# Patient Record
Sex: Female | Born: 1980 | Race: White | Hispanic: No | Marital: Married | Smoking: Former smoker
Health system: Southern US, Academic
[De-identification: ages and names within clinical notes are randomized; demographics above are authoritative.]

## PROBLEM LIST (undated history)

## (undated) DIAGNOSIS — R202 Paresthesia of skin: Secondary | ICD-10-CM

## (undated) DIAGNOSIS — M6289 Other specified disorders of muscle: Secondary | ICD-10-CM

## (undated) DIAGNOSIS — Z87891 Personal history of nicotine dependence: Secondary | ICD-10-CM

## (undated) DIAGNOSIS — E161 Other hypoglycemia: Secondary | ICD-10-CM

## (undated) DIAGNOSIS — I10 Essential (primary) hypertension: Secondary | ICD-10-CM

## (undated) DIAGNOSIS — R5383 Other fatigue: Secondary | ICD-10-CM

## (undated) DIAGNOSIS — E871 Hypo-osmolality and hyponatremia: Secondary | ICD-10-CM

## (undated) DIAGNOSIS — R0600 Dyspnea, unspecified: Secondary | ICD-10-CM

## (undated) DIAGNOSIS — M545 Low back pain, unspecified: Secondary | ICD-10-CM

## (undated) DIAGNOSIS — Z1239 Encounter for other screening for malignant neoplasm of breast: Secondary | ICD-10-CM

## (undated) DIAGNOSIS — G4700 Insomnia, unspecified: Secondary | ICD-10-CM

## (undated) DIAGNOSIS — K529 Noninfective gastroenteritis and colitis, unspecified: Secondary | ICD-10-CM

## (undated) DIAGNOSIS — E538 Deficiency of other specified B group vitamins: Secondary | ICD-10-CM

## (undated) DIAGNOSIS — N390 Urinary tract infection, site not specified: Secondary | ICD-10-CM

## (undated) DIAGNOSIS — G4733 Obstructive sleep apnea (adult) (pediatric): Secondary | ICD-10-CM

## (undated) DIAGNOSIS — M79601 Pain in right arm: Secondary | ICD-10-CM

## (undated) DIAGNOSIS — M79602 Pain in left arm: Secondary | ICD-10-CM

## (undated) DIAGNOSIS — N946 Dysmenorrhea, unspecified: Secondary | ICD-10-CM

## (undated) HISTORY — DX: Encounter for other screening for malignant neoplasm of breast: Z12.39

## (undated) HISTORY — DX: Other specified disorders of muscle: M62.89

## (undated) HISTORY — DX: Paresthesia of skin: R20.2

## (undated) HISTORY — DX: Pain in right arm: M79.601

## (undated) HISTORY — DX: Deficiency of other specified B group vitamins: E53.8

## (undated) HISTORY — DX: Insomnia, unspecified: G47.00

## (undated) HISTORY — DX: Obstructive sleep apnea (adult) (pediatric): G47.33

## (undated) HISTORY — DX: Urinary tract infection, site not specified: N39.0

## (undated) HISTORY — DX: Dyspnea, unspecified: R06.00

## (undated) HISTORY — DX: Other fatigue: R53.83

## (undated) HISTORY — DX: Hypo-osmolality and hyponatremia: E87.1

## (undated) HISTORY — DX: Pain in left arm: M79.602

## (undated) HISTORY — DX: Personal history of nicotine dependence: Z87.891

## (undated) HISTORY — DX: Other hypoglycemia: E16.1

## (undated) HISTORY — DX: Dysmenorrhea, unspecified: N94.6

## (undated) HISTORY — DX: Low back pain, unspecified: M54.50

## (undated) HISTORY — DX: Essential (primary) hypertension: I10

## (undated) HISTORY — DX: Noninfective gastroenteritis and colitis, unspecified: K52.9

## (undated) HISTORY — DX: Morbid (severe) obesity due to excess calories (CMS HCC): E66.01

---
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
## 2006-02-05 HISTORY — PX: HX LASER EYE SURGERY: 2100001140

## 2019-04-23 HISTORY — PX: TONSILLECTOMY: SUR1361

## 2021-06-14 ENCOUNTER — Other Ambulatory Visit: Payer: Self-pay

## 2021-06-14 ENCOUNTER — Ambulatory Visit (RURAL_HEALTH_CENTER): Payer: Commercial Managed Care - PPO | Attending: Family | Admitting: Family

## 2021-06-14 ENCOUNTER — Encounter (RURAL_HEALTH_CENTER): Payer: Self-pay | Admitting: Family

## 2021-06-14 VITALS — Temp 97.8°F | Resp 18 | Ht 63.0 in | Wt 178.0 lb

## 2021-06-14 DIAGNOSIS — F32A Depression, unspecified: Secondary | ICD-10-CM | POA: Insufficient documentation

## 2021-06-14 DIAGNOSIS — E669 Obesity, unspecified: Secondary | ICD-10-CM

## 2021-06-14 DIAGNOSIS — E039 Hypothyroidism, unspecified: Secondary | ICD-10-CM | POA: Insufficient documentation

## 2021-06-14 DIAGNOSIS — R7301 Impaired fasting glucose: Secondary | ICD-10-CM | POA: Insufficient documentation

## 2021-06-14 DIAGNOSIS — E282 Polycystic ovarian syndrome: Secondary | ICD-10-CM | POA: Insufficient documentation

## 2021-06-14 DIAGNOSIS — Z7984 Long term (current) use of oral hypoglycemic drugs: Secondary | ICD-10-CM | POA: Insufficient documentation

## 2021-06-14 DIAGNOSIS — E559 Vitamin D deficiency, unspecified: Secondary | ICD-10-CM | POA: Insufficient documentation

## 2021-06-14 DIAGNOSIS — E538 Deficiency of other specified B group vitamins: Secondary | ICD-10-CM | POA: Insufficient documentation

## 2021-06-14 DIAGNOSIS — Z6831 Body mass index (BMI) 31.0-31.9, adult: Secondary | ICD-10-CM | POA: Insufficient documentation

## 2021-06-14 DIAGNOSIS — K219 Gastro-esophageal reflux disease without esophagitis: Secondary | ICD-10-CM | POA: Insufficient documentation

## 2021-06-14 MED ORDER — FLUOXETINE 40 MG CAPSULE
40.0000 mg | ORAL_CAPSULE | Freq: Every day | ORAL | 1 refills | Status: DC
Start: 2021-06-14 — End: 2021-09-15

## 2021-06-14 MED ORDER — CYANOCOBALAMIN (VIT B-12) 1,000 MCG/ML INJECTION SOLUTION
1000.0000 ug | INTRAMUSCULAR | 0 refills | Status: AC
Start: 2021-06-14 — End: 2021-09-15

## 2021-06-14 MED ORDER — FAMOTIDINE 40 MG TABLET
40.0000 mg | ORAL_TABLET | Freq: Every day | ORAL | 1 refills | Status: DC
Start: 2021-06-14 — End: 2021-09-15

## 2021-06-14 MED ORDER — METFORMIN ER 500 MG TABLET,EXTENDED RELEASE 24 HR
500.0000 mg | ORAL_TABLET | Freq: Two times a day (BID) | ORAL | 1 refills | Status: DC
Start: 2021-06-14 — End: 2021-09-15

## 2021-06-14 MED ORDER — LEVOTHYROXINE 100 MCG TABLET
100.0000 ug | ORAL_TABLET | Freq: Every day | ORAL | 1 refills | Status: DC
Start: 2021-06-14 — End: 2021-09-15

## 2021-06-14 MED ORDER — SPIRONOLACTONE 100 MG TABLET
100.0000 mg | ORAL_TABLET | Freq: Every day | ORAL | 1 refills | Status: DC
Start: 2021-06-14 — End: 2021-12-22

## 2021-06-14 MED ORDER — SIMVASTATIN 40 MG TABLET
40.0000 mg | ORAL_TABLET | Freq: Every day | ORAL | 1 refills | Status: DC
Start: 2021-06-14 — End: 2021-09-15

## 2021-06-14 NOTE — Nursing Note (Signed)
Patient here today for 3 month follow-up and medication refills. Patient state  no new concerns at this time.

## 2021-06-14 NOTE — Progress Notes (Signed)
FAMILY MEDICINE, [place]  [place] [id number]  Operated by [facility]     Name: Nichole Suarez MRN:  [id number]   Date of Birth: 02-14-1980 Age: 41 y.o.   Date: 06/14/2021  Time: 08:42     Provider: Jodi Mourning, NP-C    Reason for visit: Follow Up (3 month and medication refills.)      History of Present Illness:  Nichole Suarez is a 41 y.o. female presenting with chronic disease management.    Patient is attending [facility] diabetes[facility] for management of weight and prescription of Mounjaro.  Patient has lost 60lbs in the past year.  Patient reports she is eating less and eating healthier foods.          Patient Active Problem List    Diagnosis Date Noted   . Acquired hypothyroidism 06/14/2021   . Gastroesophageal reflux disease 06/14/2021   . Depression 06/14/2021   . PCOS (polycystic ovarian syndrome) 06/14/2021   . IFG (impaired fasting glucose) 06/14/2021   . Vitamin D deficiency 06/14/2021   . B12 deficiency 06/14/2021   . Obesity (BMI 30.0-34.9) 06/14/2021       Historical Data    Past Medical History:  Past Medical History:   Diagnosis Date   . Benign hypertension    . Chronic diarrhea of unknown origin    . Dysmenorrhea    . Dyspnea    . Ex-smoker    . Fatigue    . Hyperinsulinemia    . Hyponatremia    . Insomnia    . Low back pain    . Lower urinary tract infectious disease    . Morbid obesity (CMS [place])    . Muscle fatigue    . Obstructive sleep apnea syndrome    . Paresthesia and pain of both upper extremities    . Vitamin B 12 deficiency      Past Surgical History:  Past Surgical History:   Procedure Laterality Date   . HX LASER EYE SURGERY  2008   . TONSILLECTOMY  04/23/2019     Allergies:  Allergies   Allergen Reactions   . Amoxicillin  Other Adverse Reaction (Add comment)     Abdominal pain   . Clavulanic Acid  Other Adverse Reaction (Add comment)     Abdominal pain     . Lisinopril  Other Adverse  Reaction (Add comment)     Abdominal Pain     Medications:  Current Outpatient Medications   Medication Sig   . cetirizine (ZYRTEC) 10 mg Oral Tablet Take 1 Tablet (10 mg total) by mouth Once a day   . cyanocobalamin (VITAMIN B12) 1,000 mcg/mL Injection Solution Inject 1 mL (1,000 mcg total) under the skin Every 30 days for 90 days   . famotidine (PEPCID) 40 mg Oral Tablet Take 1 Tablet (40 mg total) by mouth Once a day for 90 days   . FLUoxetine (PROZAC) 40 mg Oral Capsule Take 1 Capsule (40 mg total) by mouth Once a day for 90 days   . levothyroxine (SYNTHROID) 100 mcg Oral Tablet Take 1 Tablet (100 mcg total) by mouth Once a day for 90 days   . metFORMIN (GLUCOPHAGE XR) 500 mg Oral Tablet Sustained Release 24 hr Take 1 Tablet (500 mg total) by mouth Twice daily for 90 days   . MOUNJARO 5 mg/0.5 mL Subcutaneous Pen Injector Inject 0.5 mL (5 mg total) under the skin  Every 7 days   . norethindrone-e.estradioL-iron (equiv to: LoLOESTRIN Fe) 1 mg-10 mcg (24)/10 mcg (2) Oral Tablet Take 1 Tablet by mouth Once a day   . simvastatin (ZOCOR) 40 mg Oral Tablet Take 1 Tablet (40 mg total) by mouth Once a day for 90 days   . spironolactone (ALDACTONE) 100 mg Oral Tablet Take 1 Tablet (100 mg total) by mouth Once a day for 90 days     Family History:  Family Medical History:     Problem Relation (Age of Onset)    Diabetes type II Father    Heart Attack Father    Hypothyroidism Father    Iron deficiency Mother    Stroke Father          Social History:  Social History     Socioeconomic History   . Marital status: Married     Spouse name: Vonna Kotyk   . Number of children: 0   . Years of education: 12+   Occupational History     Comment: [employer].     Comment: [employer]; [employer]   Tobacco Use   . Smoking status: Former     Packs/day: 0.50     Years: 18.00     Pack years: 9.00     Types: Cigarettes     Start date: 1999     Quit date: 2017     Years since quitting: 6.3   . Smokeless tobacco: Current   Vaping Use    . Vaping Use: Every day   . Substances: Nicotine (3mg /1104ml lasts 3 weeks), Flavoring   . Devices: Refillable tank   Substance and Sexual Activity   . Alcohol use: Not Currently   . Drug use: Never   . Sexual activity: Yes     Partners: Male     Social Determinants of Health     Financial Resource Strain: Low Risk    . SDOH Financial: No   Transportation Needs: Low Risk    . SDOH Transportation: No   Social Connections: Low Risk    . SDOH Social Isolation: 5 or more times a week   Intimate Partner Violence: Low Risk    . SDOH Domestic Violence: No   Housing Stability: Low Risk    . SDOH Housing Situation: I have housing.   Marland Kitchen SDOH Housing Worry: No           Review of Systems:  Any pertinent Review of Systems as addressed in the HPI above.    Physical Exam:  Vital Signs:  Vitals:    06/14/21 0816   Resp: 18   Temp: 36.6 C (97.8 F)   TempSrc: Temporal   Weight: 80.7 kg (178 lb)   Height: 1.6 m (5\' 3" )   BMI: 31.6     Physical Exam  Constitutional:       Appearance: She is obese.   HENT:      Head: Normocephalic.      Right Ear: Tympanic membrane normal.      Left Ear: Tympanic membrane normal.      Nose: Nose normal.      Mouth/Throat:      Mouth: Mucous membranes are dry.      Pharynx: Oropharynx is clear.   Eyes:      Extraocular Movements: Extraocular movements intact.      Conjunctiva/sclera: Conjunctivae normal.      Pupils: Pupils are equal, round, and reactive to light.   Cardiovascular:  Rate and Rhythm: Normal rate and regular rhythm.      Pulses: Normal pulses.      Heart sounds: Normal heart sounds.   Pulmonary:      Effort: Pulmonary effort is normal.      Breath sounds: Normal breath sounds.   Abdominal:      General: Bowel sounds are normal.      Palpations: Abdomen is soft.   Musculoskeletal:         General: Normal range of motion.      Cervical back: Normal range of motion and neck supple.   Skin:     General: Skin is warm and dry.   Neurological:      General: No focal deficit present.       Mental Status: She is alert and oriented to person, place, and time.   Psychiatric:         Mood and Affect: Mood normal.         Behavior: Behavior normal.         Thought Content: Thought content normal.         Judgment: Judgment normal.          Assessment/Plan:  (E03.9) Acquired hypothyroidism  (primary encounter diagnosis)  Plan: CBC, COMPREHENSIVE METABOLIC PNL, FASTING,         HGA1C (HEMOGLOBIN A1C WITH EST AVG GLUCOSE),         INSULIN, SERUM, LIPID PANEL, MAGNESIUM, VITAMIN        B12, VITAMIN D 25 TOTAL, THYROID STIMULATING         HORMONE (SENSITIVE TSH), CANCELED: HGA1C         (HEMOGLOBIN A1C WITH EST AVG GLUCOSE),         CANCELED: CBC, CANCELED: COMPREHENSIVE         METABOLIC PNL, FASTING, CANCELED: LIPID PANEL,         CANCELED: MAGNESIUM, CANCELED: THYROID         STIMULATING HORMONE (SENSITIVE TSH), CANCELED:         VITAMIN D 25 TOTAL, CANCELED: VITAMIN B12,         CANCELED: INSULIN, SERUM    (K21.9) Gastroesophageal reflux disease, unspecified whether esophagitis present  Plan: CBC, COMPREHENSIVE METABOLIC PNL, FASTING,         HGA1C (HEMOGLOBIN A1C WITH EST AVG GLUCOSE),         INSULIN, SERUM, LIPID PANEL, MAGNESIUM, VITAMIN        B12, VITAMIN D 25 TOTAL, THYROID STIMULATING         HORMONE (SENSITIVE TSH), CANCELED: HGA1C         (HEMOGLOBIN A1C WITH EST AVG GLUCOSE),         CANCELED: CBC, CANCELED: COMPREHENSIVE         METABOLIC PNL, FASTING, CANCELED: LIPID PANEL,         CANCELED: MAGNESIUM, CANCELED: THYROID         STIMULATING HORMONE (SENSITIVE TSH), CANCELED:         VITAMIN D 25 TOTAL, CANCELED: VITAMIN B12,         CANCELED: INSULIN, SERUM    (F32.A) Depression  Plan: CBC, COMPREHENSIVE METABOLIC PNL, FASTING,         HGA1C (HEMOGLOBIN A1C WITH EST AVG GLUCOSE),         INSULIN, SERUM, LIPID PANEL, MAGNESIUM, VITAMIN        B12, VITAMIN D 25 TOTAL, THYROID STIMULATING         HORMONE (SENSITIVE TSH), CANCELED:  HGA1C         (HEMOGLOBIN A1C WITH EST AVG GLUCOSE),          CANCELED: CBC, CANCELED: COMPREHENSIVE         METABOLIC PNL, FASTING, CANCELED: LIPID PANEL,         CANCELED: MAGNESIUM, CANCELED: THYROID         STIMULATING HORMONE (SENSITIVE TSH), CANCELED:         VITAMIN D 25 TOTAL, CANCELED: VITAMIN B12,         CANCELED: INSULIN, SERUM    (E28.2) PCOS (polycystic ovarian syndrome)  Plan: CBC, COMPREHENSIVE METABOLIC PNL, FASTING,         HGA1C (HEMOGLOBIN A1C WITH EST AVG GLUCOSE),         INSULIN, SERUM, LIPID PANEL, MAGNESIUM, VITAMIN        B12, VITAMIN D 25 TOTAL, THYROID STIMULATING         HORMONE (SENSITIVE TSH), CANCELED: HGA1C         (HEMOGLOBIN A1C WITH EST AVG GLUCOSE),         CANCELED: CBC, CANCELED: COMPREHENSIVE         METABOLIC PNL, FASTING, CANCELED: LIPID PANEL,         CANCELED: MAGNESIUM, CANCELED: THYROID         STIMULATING HORMONE (SENSITIVE TSH), CANCELED:         VITAMIN D 25 TOTAL, CANCELED: VITAMIN B12,         CANCELED: INSULIN, SERUM    (R73.01) IFG (impaired fasting glucose)  Plan: CBC, COMPREHENSIVE METABOLIC PNL, FASTING,         HGA1C (HEMOGLOBIN A1C WITH EST AVG GLUCOSE),         INSULIN, SERUM, LIPID PANEL, MAGNESIUM, VITAMIN        B12, VITAMIN D 25 TOTAL, THYROID STIMULATING         HORMONE (SENSITIVE TSH), CANCELED: HGA1C         (HEMOGLOBIN A1C WITH EST AVG GLUCOSE),         CANCELED: CBC, CANCELED: COMPREHENSIVE         METABOLIC PNL, FASTING, CANCELED: LIPID PANEL,         CANCELED: MAGNESIUM, CANCELED: THYROID         STIMULATING HORMONE (SENSITIVE TSH), CANCELED:         VITAMIN D 25 TOTAL, CANCELED: VITAMIN B12,         CANCELED: INSULIN, SERUM    (E55.9) Vitamin D deficiency  Plan: CBC, COMPREHENSIVE METABOLIC PNL, FASTING,         HGA1C (HEMOGLOBIN A1C WITH EST AVG GLUCOSE),         INSULIN, SERUM, LIPID PANEL, MAGNESIUM, VITAMIN        B12, VITAMIN D 25 TOTAL, THYROID STIMULATING         HORMONE (SENSITIVE TSH), CANCELED: HGA1C         (HEMOGLOBIN A1C WITH EST AVG GLUCOSE),         CANCELED: CBC, CANCELED: COMPREHENSIVE          METABOLIC PNL, FASTING, CANCELED: LIPID PANEL,         CANCELED: MAGNESIUM, CANCELED: THYROID         STIMULATING HORMONE (SENSITIVE TSH), CANCELED:         VITAMIN D 25 TOTAL, CANCELED: VITAMIN B12,         CANCELED: INSULIN, SERUM    (E53.8) B12 deficiency  Plan: CBC, COMPREHENSIVE METABOLIC PNL, FASTING,  HGA1C (HEMOGLOBIN A1C WITH EST AVG GLUCOSE),         INSULIN, SERUM, LIPID PANEL, MAGNESIUM, VITAMIN        B12, VITAMIN D 25 TOTAL, THYROID STIMULATING         HORMONE (SENSITIVE TSH), CANCELED: HGA1C         (HEMOGLOBIN A1C WITH EST AVG GLUCOSE),         CANCELED: CBC, CANCELED: COMPREHENSIVE         METABOLIC PNL, FASTING, CANCELED: LIPID PANEL,         CANCELED: MAGNESIUM, CANCELED: THYROID         STIMULATING HORMONE (SENSITIVE TSH), CANCELED:         VITAMIN D 25 TOTAL, CANCELED: VITAMIN B12,         CANCELED: INSULIN, SERUM    (E66.9) Obesity (BMI 30.0-34.9)  Plan: CBC, COMPREHENSIVE METABOLIC PNL, FASTING,         HGA1C (HEMOGLOBIN A1C WITH EST AVG GLUCOSE),         INSULIN, SERUM, LIPID PANEL, MAGNESIUM, VITAMIN        B12, VITAMIN D 25 TOTAL, THYROID STIMULATING         HORMONE (SENSITIVE TSH), CANCELED: HGA1C         (HEMOGLOBIN A1C WITH EST AVG GLUCOSE),         CANCELED: CBC, CANCELED: COMPREHENSIVE         METABOLIC PNL, FASTING, CANCELED: LIPID PANEL,         CANCELED: MAGNESIUM, CANCELED: THYROID         STIMULATING HORMONE (SENSITIVE TSH), CANCELED:         VITAMIN D 25 TOTAL, CANCELED: VITAMIN B12,         CANCELED: INSULIN, SERUM       Labs drawn today.  Continue current medications.  Advised a low-fat/low sodium diet, advised 150 minutes of scheduled weekly physical activity as tolerated.  Advised to maintain a healthy weight.      Return in about 3 months (around 09/14/2021) for routine visit; scheduled adult wellness visit.    Jodi Mourning, NP-C     Portions of this note may be dictated using voice recognition software or a dictation service. Variances in spelling and vocabulary  are possible and unintentional. Not all errors are caught/corrected. Please notify the [facility] if any discrepancies are noted or if the meaning of any statement is not clear.

## 2021-07-29 DIAGNOSIS — E782 Mixed hyperlipidemia: Secondary | ICD-10-CM | POA: Insufficient documentation

## 2021-07-29 DIAGNOSIS — I10 Essential (primary) hypertension: Secondary | ICD-10-CM | POA: Insufficient documentation

## 2021-07-29 DIAGNOSIS — R7303 Prediabetes: Secondary | ICD-10-CM | POA: Insufficient documentation

## 2021-09-15 ENCOUNTER — Ambulatory Visit (RURAL_HEALTH_CENTER): Payer: Commercial Managed Care - PPO | Attending: Family | Admitting: Family

## 2021-09-15 ENCOUNTER — Other Ambulatory Visit: Payer: Self-pay

## 2021-09-15 ENCOUNTER — Encounter (RURAL_HEALTH_CENTER): Payer: Self-pay | Admitting: Family

## 2021-09-15 VITALS — BP 120/84 | HR 78 | Temp 98.7°F | Ht 63.0 in | Wt 168.0 lb

## 2021-09-15 DIAGNOSIS — E282 Polycystic ovarian syndrome: Secondary | ICD-10-CM | POA: Insufficient documentation

## 2021-09-15 DIAGNOSIS — F32A Depression, unspecified: Secondary | ICD-10-CM | POA: Insufficient documentation

## 2021-09-15 DIAGNOSIS — Z6830 Body mass index (BMI) 30.0-30.9, adult: Secondary | ICD-10-CM | POA: Insufficient documentation

## 2021-09-15 DIAGNOSIS — R7301 Impaired fasting glucose: Secondary | ICD-10-CM | POA: Insufficient documentation

## 2021-09-15 DIAGNOSIS — G43909 Migraine, unspecified, not intractable, without status migrainosus: Secondary | ICD-10-CM | POA: Insufficient documentation

## 2021-09-15 DIAGNOSIS — F17290 Nicotine dependence, other tobacco product, uncomplicated: Secondary | ICD-10-CM | POA: Insufficient documentation

## 2021-09-15 DIAGNOSIS — E669 Obesity, unspecified: Secondary | ICD-10-CM | POA: Insufficient documentation

## 2021-09-15 DIAGNOSIS — K219 Gastro-esophageal reflux disease without esophagitis: Secondary | ICD-10-CM | POA: Insufficient documentation

## 2021-09-15 DIAGNOSIS — E039 Hypothyroidism, unspecified: Secondary | ICD-10-CM | POA: Insufficient documentation

## 2021-09-15 DIAGNOSIS — E538 Deficiency of other specified B group vitamins: Secondary | ICD-10-CM | POA: Insufficient documentation

## 2021-09-15 DIAGNOSIS — E559 Vitamin D deficiency, unspecified: Secondary | ICD-10-CM | POA: Insufficient documentation

## 2021-09-15 MED ORDER — FLUOXETINE 40 MG CAPSULE
40.0000 mg | ORAL_CAPSULE | Freq: Every day | ORAL | 1 refills | Status: DC
Start: 2021-09-15 — End: 2021-12-22

## 2021-09-15 MED ORDER — SIMVASTATIN 40 MG TABLET
40.0000 mg | ORAL_TABLET | Freq: Every day | ORAL | 1 refills | Status: DC
Start: 2021-09-15 — End: 2021-12-22

## 2021-09-15 MED ORDER — METFORMIN ER 500 MG TABLET,EXTENDED RELEASE 24 HR
500.0000 mg | ORAL_TABLET | Freq: Two times a day (BID) | ORAL | 1 refills | Status: DC
Start: 2021-09-15 — End: 2021-12-22

## 2021-09-15 MED ORDER — LEVOTHYROXINE 100 MCG TABLET
100.0000 ug | ORAL_TABLET | Freq: Every day | ORAL | 1 refills | Status: DC
Start: 2021-09-15 — End: 2021-09-22

## 2021-09-15 MED ORDER — FAMOTIDINE 40 MG TABLET
40.0000 mg | ORAL_TABLET | Freq: Every day | ORAL | 1 refills | Status: DC
Start: 2021-09-15 — End: 2021-12-22

## 2021-09-15 MED ORDER — FLUOXETINE 40 MG CAPSULE
40.0000 mg | ORAL_CAPSULE | Freq: Every day | ORAL | 1 refills | Status: DC
Start: 2021-09-15 — End: 2021-09-15

## 2021-09-15 MED ORDER — LEVOTHYROXINE 100 MCG TABLET
100.0000 ug | ORAL_TABLET | Freq: Every day | ORAL | 1 refills | Status: DC
Start: 2021-09-15 — End: 2021-09-15

## 2021-09-15 MED ORDER — SUMATRIPTAN 25 MG TABLET
25.0000 mg | ORAL_TABLET | Freq: Once | ORAL | 0 refills | Status: DC | PRN
Start: 2021-09-15 — End: 2021-12-22

## 2021-09-15 NOTE — Progress Notes (Signed)
FAMILY MEDICINE, [facility] FAMILY MEDICINE [facility]  [address]  [place] [address]  Operated by [facility]     Name: Nichole Suarez MRN:  [id number]   Date of Birth: 04-27-1980 Age: 41 y.o.   Date: 09/15/2021  Time: 09:22     Provider: Stormy Fabian, NP-C    Reason for visit: No chief complaint on file.      History of Present Illness:  Nichole Suarez is a 41 y.o. female presenting with chronic disease management.    Patient has stopped [facility] 08/07/21.  Patient continues to lose weight, lost 10lbs since last visit.  Continue to limit portions, increase water intake and limit snacking.  Regular diet.  Exercise 1 mil 3-4 x per week on treadmill.     Patient reports a hx of menstrual migraines since the age of 14.  Reports Ibuprofen 800mg  no longer works.  Reports last migraine last week and lasted for 3 days.  Reports sensitivity to light.  Denies phonophobia.  Reports nausea with migraines, but no vomiting.  Denies numbness or tingling.      Patient Active Problem List    Diagnosis Date Noted    Migraine 09/15/2021    Acquired hypothyroidism 06/14/2021    Gastroesophageal reflux disease 06/14/2021    Depression 06/14/2021    PCOS (polycystic ovarian syndrome) 06/14/2021    IFG (impaired fasting glucose) 06/14/2021    Vitamin D deficiency 06/14/2021    B12 deficiency 06/14/2021    Obesity (BMI 30.0-34.9) 06/14/2021       Historical Data    Past Medical History:  Past Medical History:   Diagnosis Date    Benign hypertension     Chronic diarrhea of unknown origin     Dysmenorrhea     Dyspnea     Ex-smoker     Fatigue     Hyperinsulinemia     Hyponatremia     Insomnia     Low back pain     Lower urinary tract infectious disease     Morbid obesity (CMS [facility])     Muscle fatigue     Obstructive sleep apnea syndrome     Paresthesia and pain of both upper extremities     Vitamin B 12 deficiency      Past Surgical History:  Past Surgical History:   Procedure Laterality Date    HX  LASER EYE SURGERY  2008    TONSILLECTOMY  04/23/2019     Allergies:  Allergies   Allergen Reactions    Amoxicillin  Other Adverse Reaction (Add comment)     Abdominal pain    Clavulanic Acid  Other Adverse Reaction (Add comment)     Abdominal pain      Lisinopril  Other Adverse Reaction (Add comment)     Abdominal Pain     Medications:  Current Outpatient Medications   Medication Sig    cetirizine (ZYRTEC) 10 mg Oral Tablet Take 1 Tablet (10 mg total) by mouth Once a day    cyanocobalamin (VITAMIN B12) 1,000 mcg/mL Injection Solution Inject 1 mL (1,000 mcg total) under the skin Every 30 days for 90 days    famotidine (PEPCID) 40 mg Oral Tablet Take 1 Tablet (40 mg total) by mouth Once a day for 90 days    FLUoxetine (PROZAC) 40 mg Oral Capsule Take 1 Capsule (40 mg total) by mouth Once a day for 90 days    levothyroxine (SYNTHROID) 100 mcg Oral Tablet Take 1  Tablet (100 mcg total) by mouth Once a day for 90 days    metFORMIN (GLUCOPHAGE XR) 500 mg Oral Tablet Sustained Release 24 hr Take 1 Tablet (500 mg total) by mouth Twice daily for 90 days    norethindrone-e.estradioL-iron (equiv to: LoLOESTRIN Fe) 1 mg-10 mcg (24)/10 mcg (2) Oral Tablet Take 1 Tablet by mouth Once a day    simvastatin (ZOCOR) 40 mg Oral Tablet Take 1 Tablet (40 mg total) by mouth Once a day for 90 days    spironolactone (ALDACTONE) 100 mg Oral Tablet Take 1 Tablet (100 mg total) by mouth Once a day for 90 days    SUMAtriptan (IMITREX) 25 mg Oral Tablet Take 1 Tablet (25 mg total) by mouth Once, as needed for Migraine for up to 1 dose May repeat in 2 hours in needed     Family History:  Family Medical History:       Problem Relation (Age of Onset)    Diabetes type II Father    Heart Attack Father    Hypothyroidism Father    Iron deficiency Mother    No Known Problems Sister, Brother, Half-Sister, Half-Brother, Maternal Aunt, Maternal Uncle, Paternal Aunt, Paternal Uncle, Maternal Grandmother, Maternal Grandfather, Paternal Grandmother, Paternal  26, Daughter, Son    Stroke Father            Social History:  Social History     Socioeconomic History    Marital status: Married     Spouse name: Vonna Kotyk    Number of children: 0    Years of education: 12+   Occupational History     Comment: [employer].     Comment: [employer]; [employer]   Tobacco Use    Smoking status: Former     Packs/day: 0.50     Years: 18.00     Pack years: 9.00     Types: Cigarettes     Start date: 1999     Quit date: 2017     Years since quitting: 6.6    Smokeless tobacco: Never   Vaping Use    Vaping Use: Every day    Substances: Nicotine (3mg /158ml lasts 3 weeks), Flavoring    Devices: Refillable tank   Substance and Sexual Activity    Alcohol use: Not Currently    Drug use: Never    Sexual activity: Yes     Partners: Male     Social Determinants of Health     Financial Resource Strain: Low Risk     SDOH Financial: No   Transportation Needs: Low Risk     SDOH Transportation: No   Social Connections: Low Risk     SDOH Social Isolation: 5 or more times a week   Intimate Partner Violence: Low Risk     SDOH Domestic Violence: No   Housing Stability: [place] Situation: I have housing.    SDOH Housing Worry: No           Review of Systems:  Any pertinent Review of Systems as addressed in the HPI above.    Physical Exam:  Vital Signs:  Vitals:    09/15/21 0832   BP: 120/84   Pulse: 78   Temp: 37.1 C (98.7 F)   TempSrc: Tympanic   SpO2: 97%   Weight: 76.2 kg (168 lb)   Height: 1.6 m (5\' 3" )   BMI: 29.82     Physical Exam  Constitutional:  Appearance: She is obese.   HENT:      Head: Normocephalic.   Cardiovascular:      Rate and Rhythm: Normal rate and regular rhythm.      Pulses: Normal pulses.           Radial pulses are 2+ on the right side and 2+ on the left side.      Heart sounds: Normal heart sounds, S1 normal and S2 normal.   Pulmonary:      Effort: Pulmonary effort is normal.      Breath sounds: Normal breath sounds.   Abdominal:       General: Bowel sounds are normal.      Palpations: Abdomen is soft.   Musculoskeletal:         General: Normal range of motion.      Cervical back: Neck supple.      Right lower leg: No edema.      Left lower leg: No edema.   Skin:     General: Skin is warm.   Neurological:      General: No focal deficit present.      Mental Status: She is alert and oriented to person, place, and time.      Cranial Nerves: Cranial nerves 2-12 are intact.      Coordination: Coordination is intact.      Gait: Gait is intact.   Psychiatric:         Mood and Affect: Mood normal.         Speech: Speech normal.         Behavior: Behavior normal. Behavior is cooperative.         Thought Content: Thought content normal.         Cognition and Memory: Cognition normal.         Judgment: Judgment normal.        Assessment/Plan:  (E03.9) Acquired hypothyroidism  (primary encounter diagnosis)  Plan: HGA1C (HEMOGLOBIN A1C WITH EST AVG GLUCOSE),         MICROALBUMIN/CREATININE RATIO, URINE, RANDOM,         CBC, COMPREHENSIVE METABOLIC PNL, FASTING,         LIPID PANEL, MAGNESIUM, THYROID STIMULATING         HORMONE (SENSITIVE TSH), VITAMIN D 25 TOTAL,         VITAMIN B12, INSULIN, SERUM    (K21.9) Gastroesophageal reflux disease, unspecified whether esophagitis present  Plan: HGA1C (HEMOGLOBIN A1C WITH EST AVG GLUCOSE),         MICROALBUMIN/CREATININE RATIO, URINE, RANDOM,         CBC, COMPREHENSIVE METABOLIC PNL, FASTING,         LIPID PANEL, MAGNESIUM, THYROID STIMULATING         HORMONE (SENSITIVE TSH), VITAMIN D 25 TOTAL,         VITAMIN B12, INSULIN, SERUM    (F32.A) Depression, unspecified depression type  Plan: HGA1C (HEMOGLOBIN A1C WITH EST AVG GLUCOSE),         MICROALBUMIN/CREATININE RATIO, URINE, RANDOM,         CBC, COMPREHENSIVE METABOLIC PNL, FASTING,         LIPID PANEL, MAGNESIUM, THYROID STIMULATING         HORMONE (SENSITIVE TSH), VITAMIN D 25 TOTAL,         VITAMIN B12, INSULIN, SERUM    (E28.2) PCOS (polycystic ovarian  syndrome)  Plan: HGA1C (HEMOGLOBIN A1C WITH EST AVG GLUCOSE),  MICROALBUMIN/CREATININE RATIO, URINE, RANDOM,         CBC, COMPREHENSIVE METABOLIC PNL, FASTING,         LIPID PANEL, MAGNESIUM, THYROID STIMULATING         HORMONE (SENSITIVE TSH), VITAMIN D 25 TOTAL,         VITAMIN B12, INSULIN, SERUM    (E66.9) Obesity (BMI 30.0-34.9)  Plan: HGA1C (HEMOGLOBIN A1C WITH EST AVG GLUCOSE),         MICROALBUMIN/CREATININE RATIO, URINE, RANDOM,         CBC, COMPREHENSIVE METABOLIC PNL, FASTING,         LIPID PANEL, MAGNESIUM, THYROID STIMULATING         HORMONE (SENSITIVE TSH), VITAMIN D 25 TOTAL,         VITAMIN B12, INSULIN, SERUM    (R73.01) IFG (impaired fasting glucose)  Plan: HGA1C (HEMOGLOBIN A1C WITH EST AVG GLUCOSE),         MICROALBUMIN/CREATININE RATIO, URINE, RANDOM,         CBC, COMPREHENSIVE METABOLIC PNL, FASTING,         LIPID PANEL, MAGNESIUM, THYROID STIMULATING         HORMONE (SENSITIVE TSH), VITAMIN D 25 TOTAL,         VITAMIN B12, INSULIN, SERUM    (E55.9) Vitamin D deficiency  Plan: HGA1C (HEMOGLOBIN A1C WITH EST AVG GLUCOSE),         MICROALBUMIN/CREATININE RATIO, URINE, RANDOM,         CBC, COMPREHENSIVE METABOLIC PNL, FASTING,         LIPID PANEL, MAGNESIUM, THYROID STIMULATING         HORMONE (SENSITIVE TSH), VITAMIN D 25 TOTAL,         VITAMIN B12, INSULIN, SERUM    (E53.8) B12 deficiency  Plan: HGA1C (HEMOGLOBIN A1C WITH EST AVG GLUCOSE),         MICROALBUMIN/CREATININE RATIO, URINE, RANDOM,         CBC, COMPREHENSIVE METABOLIC PNL, FASTING,         LIPID PANEL, MAGNESIUM, THYROID STIMULATING         HORMONE (SENSITIVE TSH), VITAMIN D 25 TOTAL,         VITAMIN B12, INSULIN, SERUM    (G43.909) Migraine   Start Imitrex for migraine relief.  Discussed s/e and risks.     Labs drawn today.  Continue current medications.  Advised a low-fat/low sodium diet, advised 150 minutes of scheduled weekly physical activity as tolerated.  Advised to maintain a healthy weight.     Return in about 3 months  (around 12/16/2021) for routine visit; schedule adult wellness visit.    Jodi Mourning, NP-C     Portions of this note may be dictated using voice recognition software or a dictation service. Variances in spelling and vocabulary are possible and unintentional. Not all errors are caught/corrected. Please notify the [facility] if any discrepancies are noted or if the meaning of any statement is not clear.

## 2021-09-22 ENCOUNTER — Other Ambulatory Visit (RURAL_HEALTH_CENTER): Payer: Self-pay | Admitting: Family

## 2021-09-22 MED ORDER — LEVOTHYROXINE 100 MCG TABLET
100.0000 ug | ORAL_TABLET | Freq: Every day | ORAL | 2 refills | Status: DC
Start: 2021-09-22 — End: 2021-12-22

## 2021-10-02 ENCOUNTER — Ambulatory Visit (RURAL_HEALTH_CENTER): Payer: Self-pay | Admitting: Family

## 2021-10-27 IMAGING — CT CT MAXILLOFACIAL W/O CONTRAST
3 of 4 series · 16 of 47 positions shown, 19 images · non-contrast
Comparison: None previous available.

﻿EXAM:  CT MAXILLOFACIAL W/O CONTRAST
INDICATION: 40-year-old sustained trauma to the bridge of the nose on [DATE]th.  Pain.  Earlier history of fracture of the nose as a child.
TECHNIQUE: CT scan of facial bones and sinuses were obtained and reviewed in multiple projections.  Radiation dose 653 mGy cm.  Images were reviewed in multiple windows and projections. Exam was performed using 1 or more of the following dose reduction techniques: Automated exposure control, adjustment of the mA and/or kV according to patient size, or the use of iterative reconstruction technique.

[axial · axial · 0.39mm/px · z∈[+28,+159]mm · 10 of 129 slices shown, 13 images]
[im 13/129  brain]
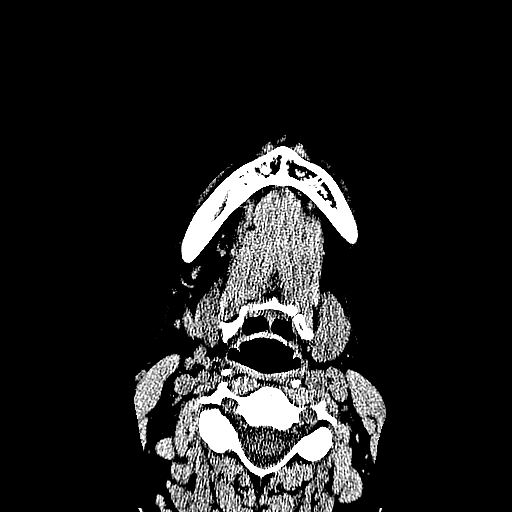
[im 13/129  bone]
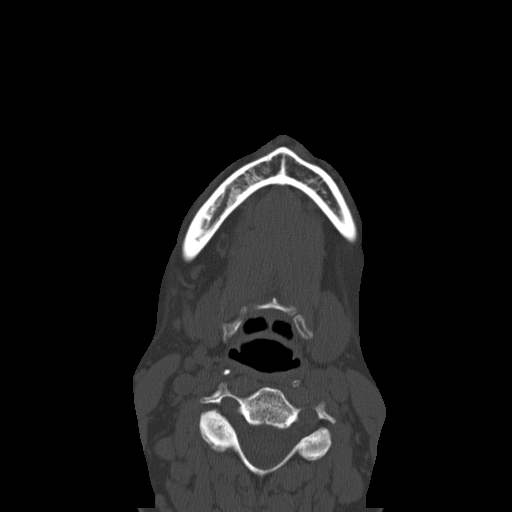
[im 25/129  bone]
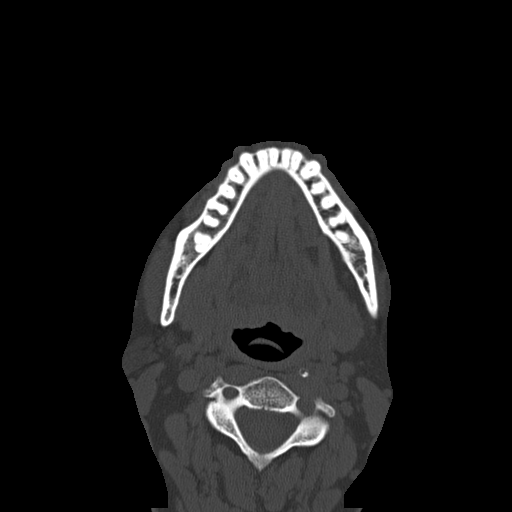
[im 37/129  bone]
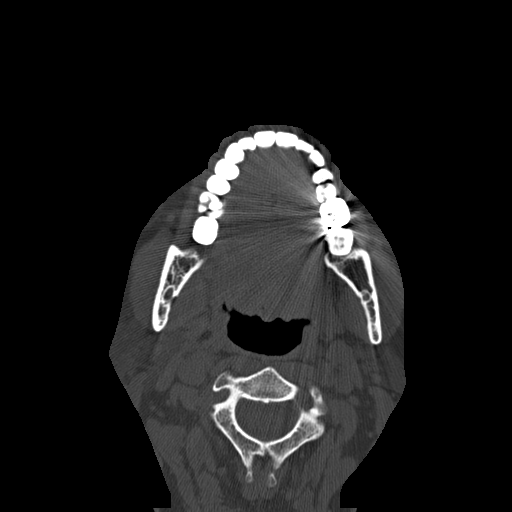
[im 49/129  bone]
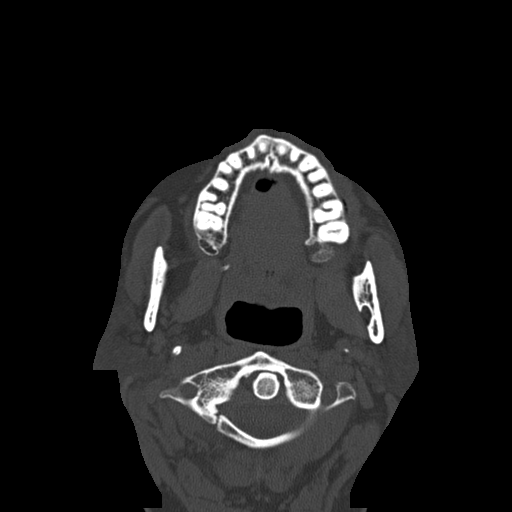
[im 61/129  brain]
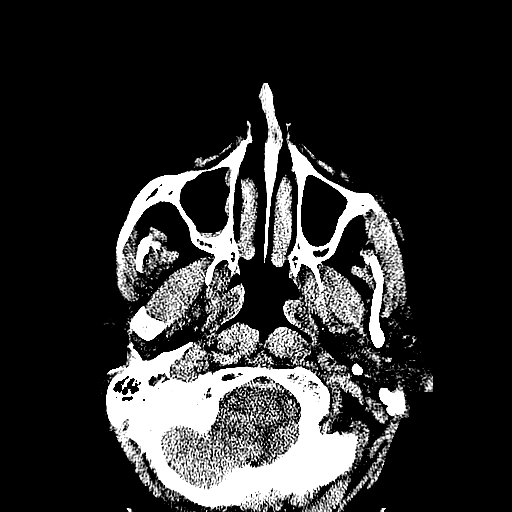
[im 61/129  bone]
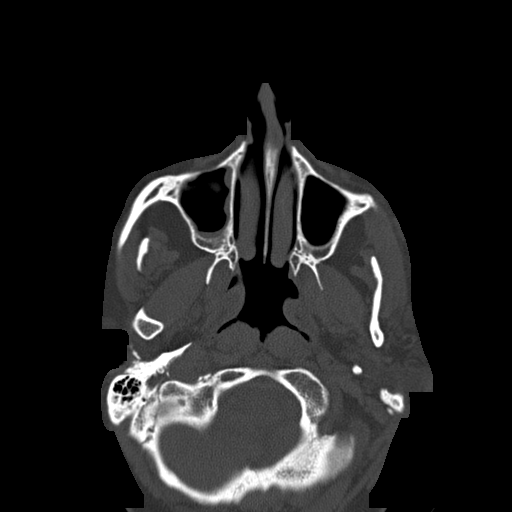
[im 74/129  bone]
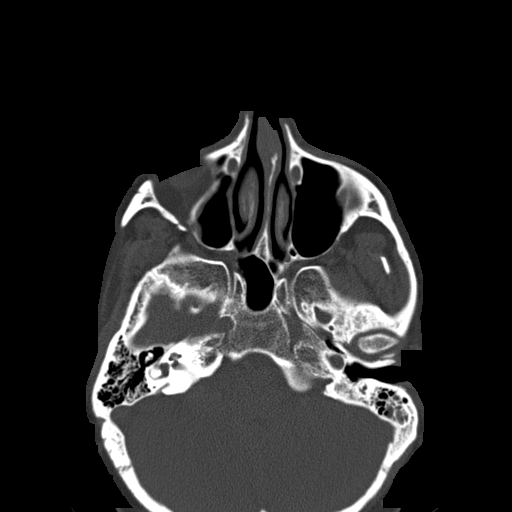
[im 86/129  bone]
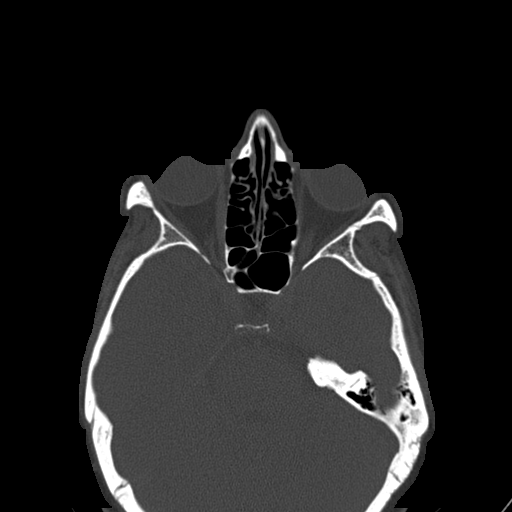
[im 98/129  bone]
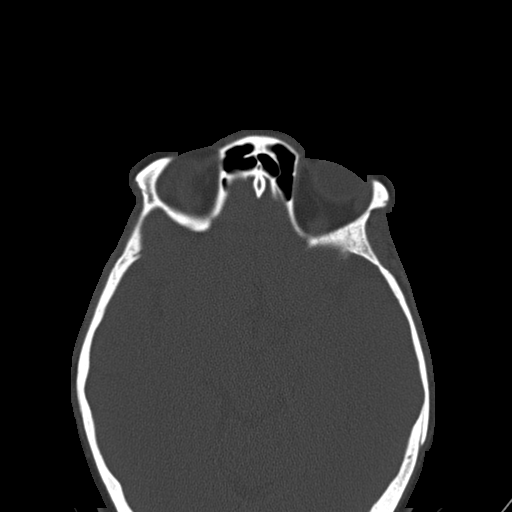
[im 110/129  brain]
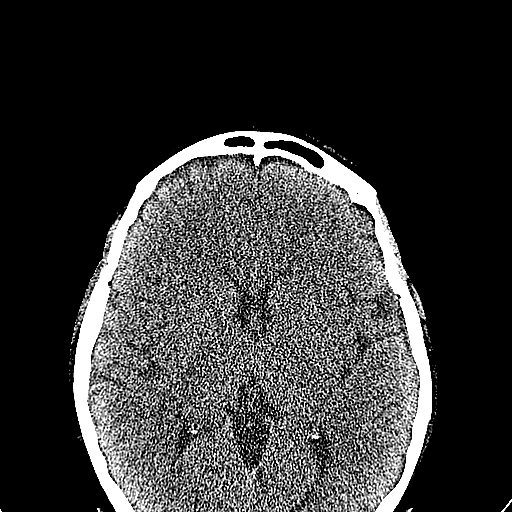
[im 110/129  bone]
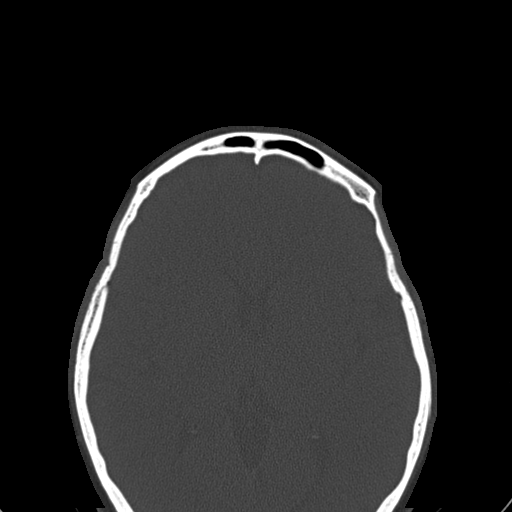
[im 122/129  bone]
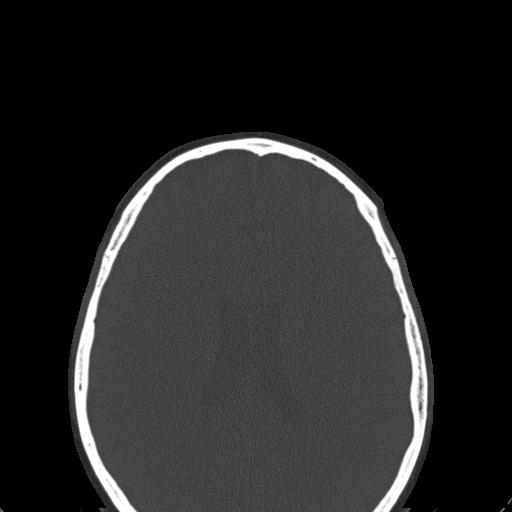

[cor · coronal · 0.39mm/px · 3 of 111 slices shown]
[im 49/111  bone]
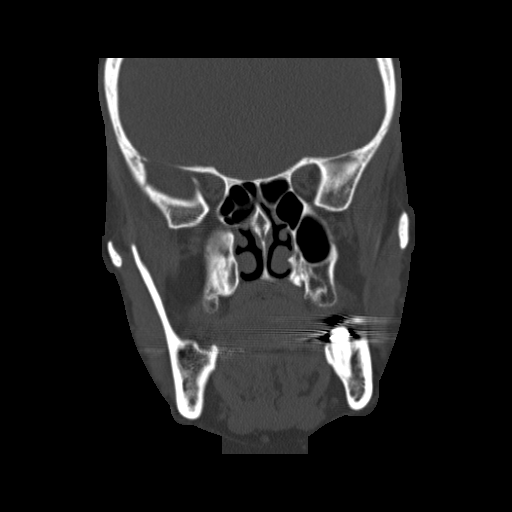
[im 62/111  bone]
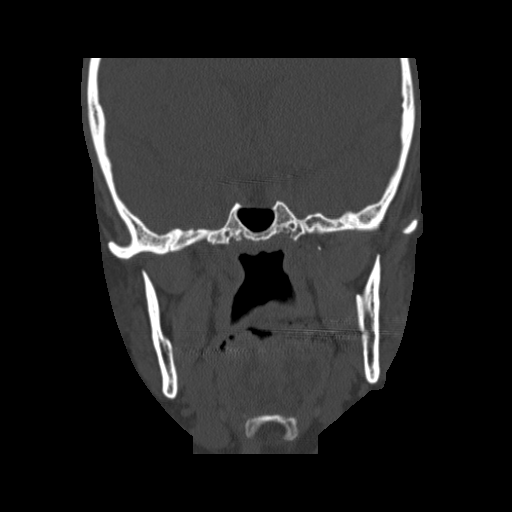
[im 74/111  bone]
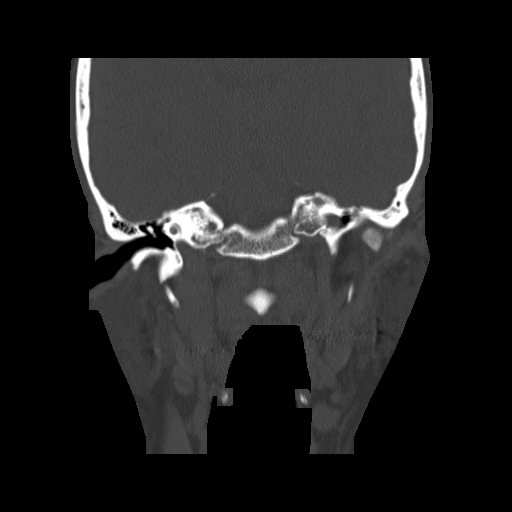

[sag · sagittal · 0.39mm/px · 3 of 94 slices shown]
[im 32/94  bone]
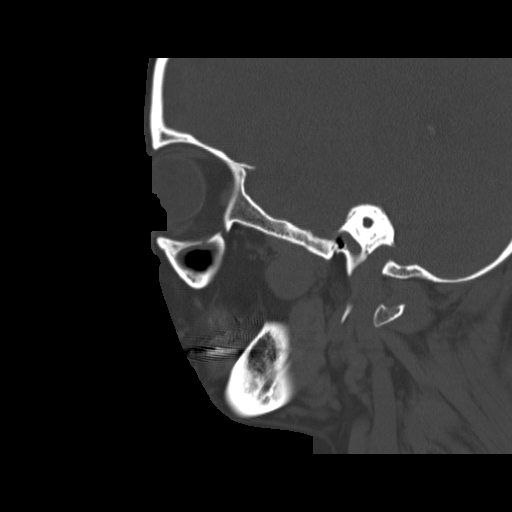
[im 47/94  bone]
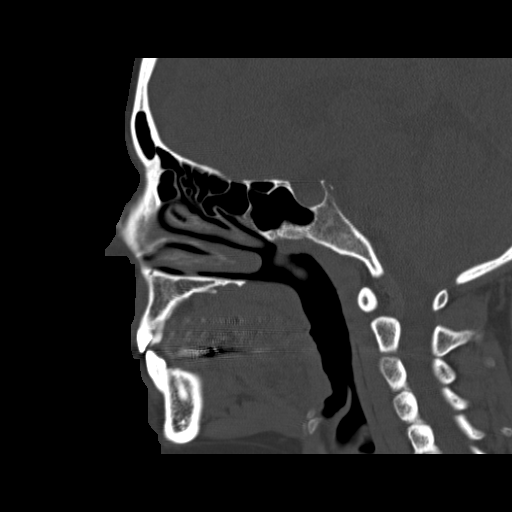
[im 63/94  bone]
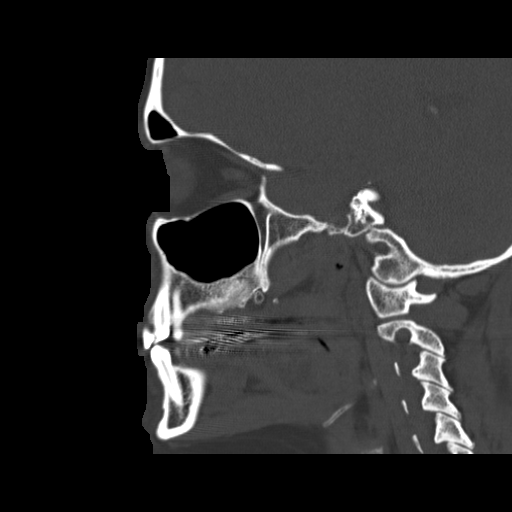

[16 of 47 positions shown; findings below may reference images not displayed]

FINDINGS: No acute fractures of the maxillary bone and nasal bones are noted.  Mild mucosal inflammation of right maxillary sinus is noted.  Ethmoid, sphenoid and frontal sinuses are clear.

Zygomatic arches are normal.  No acute bony lesions of the mandible are seen.
IMPRESSION: 1. No acute facial fractures including nasal bones, maxilla.  

2. Mild inflammatory changes of right maxillary sinus.

## 2021-10-30 ENCOUNTER — Encounter (RURAL_HEALTH_CENTER): Payer: Self-pay | Admitting: Family

## 2021-10-30 ENCOUNTER — Ambulatory Visit (RURAL_HEALTH_CENTER): Payer: Commercial Managed Care - PPO | Attending: Family | Admitting: Family

## 2021-10-30 ENCOUNTER — Other Ambulatory Visit: Payer: Self-pay

## 2021-10-30 VITALS — BP 120/87 | HR 75 | Temp 97.9°F | Resp 18 | Ht 63.0 in | Wt 171.4 lb

## 2021-10-30 DIAGNOSIS — Z1239 Encounter for other screening for malignant neoplasm of breast: Secondary | ICD-10-CM | POA: Insufficient documentation

## 2021-10-30 DIAGNOSIS — Z0000 Encounter for general adult medical examination without abnormal findings: Secondary | ICD-10-CM | POA: Insufficient documentation

## 2021-10-30 DIAGNOSIS — F17290 Nicotine dependence, other tobacco product, uncomplicated: Secondary | ICD-10-CM | POA: Insufficient documentation

## 2021-10-30 HISTORY — DX: Encounter for other screening for malignant neoplasm of breast: Z12.39

## 2021-10-30 LAB — POCT URINE DIPSTICK
BILIRUBIN: NEGATIVE
BLOOD: NEGATIVE
GLUCOSE: NEGATIVE
KETONE: NEGATIVE
NITRITE: NEGATIVE
PH: 5.5
PROTEIN: NEGATIVE
SPECIFIC GRAVITY: 1.015
UROBILINOGEN: 0.2

## 2021-10-30 NOTE — Progress Notes (Signed)
[facility]      Nichole Suarez  MRN: [id number]  DOB: 19-Feb-1980  Date of Service: 10/30/2021    CHIEF COMPLAINT  Chief Complaint   Patient presents with    Well Check Adult       HISTORY OF PRESENT ILLNESS  Nichole Suarez is a 41 y.o. female presenting to clinic to Adult Wellness Visit.     Patient Active Problem List    Diagnosis Date Noted    Encounter for screening for malignant neoplasm of breast 10/30/2021    Migraine 09/15/2021    Acquired hypothyroidism 06/14/2021    Gastroesophageal reflux disease 06/14/2021    Depression 06/14/2021    PCOS (polycystic ovarian syndrome) 06/14/2021    IFG (impaired fasting glucose) 06/14/2021    Vitamin D deficiency 06/14/2021    B12 deficiency 06/14/2021    Obesity (BMI 30.0-34.9) 06/14/2021       PAST MEDICAL HISTORY  Past Medical History:   Diagnosis Date    Benign hypertension     Chronic diarrhea of unknown origin     Dysmenorrhea     Dyspnea     Ex-smoker     Fatigue     Hyperinsulinemia     Hyponatremia     Insomnia     Low back pain     Lower urinary tract infectious disease     Morbid obesity (CMS HCC)     Muscle fatigue     Obstructive sleep apnea syndrome     Paresthesia and pain of both upper extremities     Vitamin B 12 deficiency         MEDICATIONS  cetirizine (ZYRTEC) 10 mg Oral Tablet, Take 1 Tablet (10 mg total) by mouth Once a day  famotidine (PEPCID) 40 mg Oral Tablet, Take 1 Tablet (40 mg total) by mouth Once a day for 90 days  FLUoxetine (PROZAC) 40 mg Oral Capsule, Take 1 Capsule (40 mg total) by mouth Once a day for 90 days  levothyroxine (SYNTHROID) 100 mcg Oral Tablet, Take 1 Tablet (100 mcg total) by mouth Once a day for 90 days  metFORMIN (GLUCOPHAGE XR) 500 mg Oral Tablet Sustained Release 24 hr, Take 1 Tablet (500 mg total) by mouth Twice daily for 90 days  norethindrone-e.estradioL-iron (equiv to: LoLOESTRIN Fe) 1 mg-10 mcg (24)/10 mcg (2) Oral Tablet, Take 1 Tablet by mouth  Once a day  simvastatin (ZOCOR) 40 mg Oral Tablet, Take 1 Tablet (40 mg total) by mouth Once a day for 90 days  spironolactone (ALDACTONE) 100 mg Oral Tablet, Take 1 Tablet (100 mg total) by mouth Once a day for 90 days  SUMAtriptan (IMITREX) 25 mg Oral Tablet, Take 1 Tablet (25 mg total) by mouth Once, as needed for Migraine for up to 1 dose May repeat in 2 hours in needed    No facility-administered medications prior to visit.      ALLERGIES  Allergies   Allergen Reactions    Amoxicillin  Other Adverse Reaction (Add comment)     Abdominal pain    Clavulanic Acid  Other Adverse Reaction (Add comment)     Abdominal pain      Lisinopril  Other Adverse Reaction (Add comment)     Abdominal Pain       PAST SURGICAL HISTORY  Past Surgical History:   Procedure Laterality Date    HX LASER EYE SURGERY  2008    TONSILLECTOMY  04/23/2019  IMMUNIZATIONS  Immunization History   Administered Date(s) Administered    HEPATITIS B VIRUS RECOMB VACCINE 04/04/2020, 05/04/2020    Influenza Vaccine, 6 month-adult 11/19/2019    MEASLES/MUMPS/RUBELLA VIRUS VACCINE 03/17/1981, 05/10/1991       FAMILY HISTORY  Family Medical History:       Problem Relation (Age of Onset)    Diabetes type II Father    Heart Attack Father    Hypothyroidism Father    Iron deficiency Mother    No Known Problems Sister, Brother, Maternal Grandmother, Maternal Grandfather, Paternal Grandmother, Paternal Grandfather, Daughter, Son, Maternal Aunt, Maternal Uncle, Paternal Aunt, Paternal Uncle, Half-Brother, Half-Sister    Prostate Cancer Father    Stroke Father              SOCIAL HISTORY  Social History     Socioeconomic History    Marital status: Married     Spouse name: Ivin Booty    Number of children: 0    Years of education: 12+   Occupational History     Comment: [employer].     Comment: [employer]; [employer]   Tobacco Use    Smoking status: Former     Packs/day: 0.50     Years: 18.00     Additional pack years: 0.00     Total pack years: 9.00      Types: Cigarettes     Start date: 1999     Quit date: 2017     Years since quitting: 6.7    Smokeless tobacco: Never   Vaping Use    Vaping Use: Every day    Substances: Nicotine (3mg /175ml lasts 3 weeks), Flavoring    Devices: Refillable tank   Substance and Sexual Activity    Alcohol use: Not Currently    Drug use: Never    Sexual activity: Yes     Partners: Male     Comment: LMP: 09/30/2021 [id number]     Social Determinants of Health     Financial Resource Strain: Low Risk  (10/30/2021)    Financial Resource Strain     SDOH Financial: No   Transportation Needs: Low Risk  (10/30/2021)    Transportation Needs     SDOH Transportation: No   Social Connections: Low Risk  (10/30/2021)    Social Connections     SDOH Social Isolation: 5 or more times a week   Intimate Partner Violence: Low Risk  (10/30/2021)    Intimate Partner Violence     SDOH Domestic Violence: No   Housing Stability: Low Risk  (10/30/2021)    Housing Stability     SDOH Housing Situation: I have housing.     SDOH Housing Worry: No       REVIEW OF SYSTEMS  Positive ROS discussed in HPI, otherwise all other systems negative.      PHYSICAL EXAM  Vitals: Blood pressure (!) 141/82, pulse 75, temperature 36.6 C (97.9 F), resp. rate 18, height 1.6 m (5\' 3" ), weight 77.7 kg (171 lb 6.4 oz), SpO2 100%. Body mass index is 30.36 kg/m.  Physical Exam  Constitutional:       Appearance: She is obese.   HENT:      Head: Normocephalic.      Right Ear: Tympanic membrane, ear canal and external ear normal.      Left Ear: Tympanic membrane, ear canal and external ear normal.      Nose: Nose normal.      Mouth/Throat:  Mouth: Mucous membranes are moist.   Eyes:      General: Lids are normal. Lids are everted, no foreign bodies appreciated. Vision grossly intact. Gaze aligned appropriately.      Extraocular Movements: Extraocular movements intact.      Conjunctiva/sclera: Conjunctivae normal.      Pupils: Pupils are equal, round, and reactive to light.   Neck:       Trachea: Trachea and phonation normal.   Cardiovascular:      Rate and Rhythm: Normal rate and regular rhythm.      Pulses: Normal pulses.           Radial pulses are 2+ on the right side and 2+ on the left side.        Dorsalis pedis pulses are 2+ on the right side and 2+ on the left side.      Heart sounds: Normal heart sounds, S1 normal and S2 normal.   Pulmonary:      Effort: Pulmonary effort is normal.      Breath sounds: Normal breath sounds.   Chest:   Breasts:     Right: Normal.      Left: Normal.   Abdominal:      General: Bowel sounds are normal.      Palpations: Abdomen is soft.   Genitourinary:     Comments: deferred  Musculoskeletal:      Cervical back: Normal range of motion and neck supple.      Right lower leg: No edema.      Left lower leg: No edema.   Neurological:      Mental Status: She is alert.      Cranial Nerves: Cranial nerves 2-12 are intact.      Motor: Motor function is intact.      Coordination: Coordination is intact.   Psychiatric:         Attention and Perception: Attention normal.         Mood and Affect: Mood normal.         Speech: Speech normal.         Behavior: Behavior is cooperative.         Cognition and Memory: Cognition normal.          ASSESSMENT AND PLAN  (Z00.00) Well adult exam  (primary encounter diagnosis)  Plan: POCT Urine Dipstick, CBC, COMPREHENSIVE         METABOLIC PNL, FASTING    (Z12.39) Encounter for screening for malignant neoplasm of breast, unspecified screening modality  Plan: MAMMO BILATERAL SCREENING      Patient will bring a copy of Tetanus vaccine. Flu vaccine to be given next week through employer.  Mammogram ordered today.  Patient is scheduled to have a PAP smear on 12/27/21 with Dr. Molli Barrows, will request records.  Discussed safety.  Advanced directives reviewed and discussed.  A copy of advance directive given to patient to complete, have notarized return to clinic.  Advised to continue low-fat/low-sodium diet, advised 150 minutes of scheduled  weekly physical activity as tolerated and maintain a healthy weight.      Return in about 1 year (around 10/31/2022) for adult wellness visit.      Stormy Fabian, NP-C 10/30/2021, 08:42

## 2021-12-22 ENCOUNTER — Ambulatory Visit (RURAL_HEALTH_CENTER): Payer: Commercial Managed Care - PPO | Attending: Family | Admitting: Family

## 2021-12-22 ENCOUNTER — Other Ambulatory Visit: Payer: Self-pay

## 2021-12-22 ENCOUNTER — Encounter (RURAL_HEALTH_CENTER): Payer: Self-pay | Admitting: Family

## 2021-12-22 VITALS — BP 100/70 | HR 64 | Temp 99.5°F | Ht 63.0 in | Wt 170.0 lb

## 2021-12-22 DIAGNOSIS — E538 Deficiency of other specified B group vitamins: Secondary | ICD-10-CM | POA: Insufficient documentation

## 2021-12-22 DIAGNOSIS — G43909 Migraine, unspecified, not intractable, without status migrainosus: Secondary | ICD-10-CM | POA: Insufficient documentation

## 2021-12-22 DIAGNOSIS — E559 Vitamin D deficiency, unspecified: Secondary | ICD-10-CM | POA: Insufficient documentation

## 2021-12-22 DIAGNOSIS — E669 Obesity, unspecified: Secondary | ICD-10-CM | POA: Insufficient documentation

## 2021-12-22 DIAGNOSIS — F32A Depression, unspecified: Secondary | ICD-10-CM | POA: Insufficient documentation

## 2021-12-22 DIAGNOSIS — K219 Gastro-esophageal reflux disease without esophagitis: Secondary | ICD-10-CM | POA: Insufficient documentation

## 2021-12-22 DIAGNOSIS — E039 Hypothyroidism, unspecified: Secondary | ICD-10-CM | POA: Insufficient documentation

## 2021-12-22 DIAGNOSIS — E7800 Pure hypercholesterolemia, unspecified: Secondary | ICD-10-CM | POA: Insufficient documentation

## 2021-12-22 DIAGNOSIS — R7301 Impaired fasting glucose: Secondary | ICD-10-CM | POA: Insufficient documentation

## 2021-12-22 DIAGNOSIS — E785 Hyperlipidemia, unspecified: Secondary | ICD-10-CM | POA: Insufficient documentation

## 2021-12-22 DIAGNOSIS — Z6830 Body mass index (BMI) 30.0-30.9, adult: Secondary | ICD-10-CM | POA: Insufficient documentation

## 2021-12-22 DIAGNOSIS — E282 Polycystic ovarian syndrome: Secondary | ICD-10-CM | POA: Insufficient documentation

## 2021-12-22 MED ORDER — FAMOTIDINE 40 MG TABLET
40.0000 mg | ORAL_TABLET | Freq: Every day | ORAL | 1 refills | Status: DC
Start: 2021-12-22 — End: 2022-03-23

## 2021-12-22 MED ORDER — SUMATRIPTAN 25 MG TABLET
25.0000 mg | ORAL_TABLET | Freq: Once | ORAL | 2 refills | Status: AC | PRN
Start: 2021-12-22 — End: 2022-01-21

## 2021-12-22 MED ORDER — FLUOXETINE 40 MG CAPSULE
40.0000 mg | ORAL_CAPSULE | Freq: Every day | ORAL | 1 refills | Status: DC
Start: 2021-12-22 — End: 2022-03-23

## 2021-12-22 MED ORDER — METFORMIN ER 500 MG TABLET,EXTENDED RELEASE 24 HR
500.0000 mg | ORAL_TABLET | Freq: Two times a day (BID) | ORAL | 1 refills | Status: DC
Start: 2021-12-22 — End: 2022-03-23

## 2021-12-22 MED ORDER — SPIRONOLACTONE 100 MG TABLET
100.0000 mg | ORAL_TABLET | Freq: Every day | ORAL | 1 refills | Status: DC
Start: 2021-12-22 — End: 2022-03-23

## 2021-12-22 MED ORDER — SIMVASTATIN 40 MG TABLET
40.0000 mg | ORAL_TABLET | Freq: Every day | ORAL | 1 refills | Status: DC
Start: 2021-12-22 — End: 2022-03-23

## 2021-12-22 MED ORDER — LEVOTHYROXINE 100 MCG TABLET
100.0000 ug | ORAL_TABLET | Freq: Every day | ORAL | 1 refills | Status: DC
Start: 2021-12-22 — End: 2022-03-23

## 2021-12-22 NOTE — Assessment & Plan Note (Signed)
Continue levothyroxine 100 mcg daily.  Denies missed doses.  Continue to take levothyroxine on an empty stomach in the morning, wait 30 minutes prior to eating/drinking.

## 2021-12-22 NOTE — Assessment & Plan Note (Signed)
Symptoms well controlled.  Continue famotidine 40 mg daily.

## 2021-12-22 NOTE — Assessment & Plan Note (Signed)
Denies SI/HI/AVH.  Continue fluoxetine 40 mg daily.  Patient is not interested in counseling or psychology referral this time.  Advised follow up in 3 weeks since patient is complaining of anxiety for the past 3 days.  Advised to follow-up if symptoms progress or do not resolve.  Patient agree with plan of care.

## 2021-12-22 NOTE — Assessment & Plan Note (Addendum)
Last migraine was 12/21/2021, relieved with 1 dose of Imitrex.  Continue medications as prescribed

## 2021-12-22 NOTE — Assessment & Plan Note (Signed)
A copy of lipid panel from work wellness screening dated 12/01/2021 presented today.  Lipid panel scanned into chart.  Triglycerides, 271, LDL 160, HDL 99 and total cholesterol 274.  Patient will restart simvastatin 40 mg daily.  Advised to limit high fat foods, processed foods and fast foods in diet.  Recheck in 3 months.

## 2021-12-22 NOTE — Assessment & Plan Note (Signed)
Continue metformin 500 mg b.i.d.

## 2021-12-22 NOTE — Progress Notes (Signed)
FAMILY MEDICINE, [place]  [place] [id number]  Operated by [facility]     Name: Nichole Suarez MRN:  [id number]   Date of Birth: [birth year] Age: 41 y.o.   Date: 12/22/2021  Time: 09:29     Provider: Jodi Mourning, NP-C    Reason for visit: Follow Up (Routine follow up she is having some anxiety issues)      History of Present Illness:  Nichole Suarez is a 41 y.o. female presenting with chronic disease management.      Patient reports she is experiencing anxiety x 3 days.  Reports she also had a migraine yesterday which was relieved with Imitrex x1 dose.  Patient denies major life events.     Denies SI/HI/AVH.  Denies change in medications.  Patient reports a regular diet.  Denies scheduled exercise.  Works full time.  Lives with husband.  Reports she and her husband are doing well.  Denies recent hospitalizations or surgeries.  Denies medication changes.    Denies chest pain shortness for breath.  Denies fever, cough, nausea, vomiting or diarrhea.    Patient Active Problem List    Diagnosis Date Noted    Hyperlipidemia 12/22/2021    Migraine 09/15/2021    Acquired hypothyroidism 06/14/2021    Gastroesophageal reflux disease 06/14/2021    Depression 06/14/2021    PCOS (polycystic ovarian syndrome) 06/14/2021    IFG (impaired fasting glucose) 06/14/2021    Vitamin D deficiency 06/14/2021    B12 deficiency 06/14/2021    Obesity (BMI 30.0-34.9) 06/14/2021       Historical Data    Past Medical History:  Past Medical History:   Diagnosis Date    Benign hypertension     Chronic diarrhea of unknown origin     Dysmenorrhea     Dyspnea     Encounter for screening for malignant neoplasm of breast     Ex-smoker     Fatigue     Hyperinsulinemia     Hyponatremia     Insomnia     Low back pain     Lower urinary tract infectious disease     Morbid obesity (CMS HCC)     Muscle fatigue     Obstructive sleep apnea syndrome     Paresthesia and pain of both  upper extremities     Vitamin B 12 deficiency      Past Surgical History:  Past Surgical History:   Procedure Laterality Date    HX LASER EYE SURGERY  2008    TONSILLECTOMY  04/23/2019     Allergies:  Allergies   Allergen Reactions    Amoxicillin  Other Adverse Reaction (Add comment)     Abdominal pain    Clavulanic Acid  Other Adverse Reaction (Add comment)     Abdominal pain      Lisinopril  Other Adverse Reaction (Add comment)     Abdominal Pain     Medications:  Current Outpatient Medications   Medication Sig    cetirizine (ZYRTEC) 10 mg Oral Tablet Take 1 Tablet (10 mg total) by mouth Once a day    famotidine (PEPCID) 40 mg Oral Tablet Take 1 Tablet (40 mg total) by mouth Once a day for 90 days    FLUoxetine (PROZAC) 40 mg Oral Capsule Take 1 Capsule (40 mg total) by mouth Once a day for 90 days    levothyroxine (SYNTHROID) 100 mcg Oral Tablet Take 1 Tablet (100  mcg total) by mouth Once a day for 90 days    metFORMIN (GLUCOPHAGE XR) 500 mg Oral Tablet Sustained Release 24 hr Take 1 Tablet (500 mg total) by mouth Twice daily for 90 days    norethindrone-e.estradioL-iron (equiv to: LoLOESTRIN Fe) 1 mg-10 mcg (24)/10 mcg (2) Oral Tablet Take 1 Tablet by mouth Once a day    simvastatin (ZOCOR) 40 mg Oral Tablet Take 1 Tablet (40 mg total) by mouth Once a day for 90 days    spironolactone (ALDACTONE) 100 mg Oral Tablet Take 1 Tablet (100 mg total) by mouth Once a day for 90 days    SUMAtriptan (IMITREX) 25 mg Oral Tablet Take 1 Tablet (25 mg total) by mouth Once, as needed for Migraine for up to 30 days May repeat in 2 hours in needed     Family History:  Family Medical History:       Problem Relation (Age of Onset)    Bipolar Disorder Sister    Diabetes type II Father    Heart Attack Father    Hypothyroidism Father    Iron deficiency Mother    No Known Problems Brother, Maternal Grandmother, Maternal Grandfather, Paternal Grandmother, Paternal Grandfather, Daughter, Son, Maternal Aunt, Maternal Uncle, Paternal  27, Paternal Uncle, Half-Brother, Half-Sister    Prostate Cancer Father    Stroke Father            Social History:  Social History     Socioeconomic History    Marital status: Married     Spouse name: Vonna Kotyk    Number of children: 0    Years of education: 12+   Occupational History     Comment: [employer].     Comment: Employeed; [employer]   Tobacco Use    Smoking status: Former     Packs/day: 0.50     Years: 18.00     Additional pack years: 0.00     Total pack years: 9.00     Types: Cigarettes     Start date: 1999     Quit date: 2017     Years since quitting: 6.8    Smokeless tobacco: Never   Vaping Use    Vaping Use: Every day    Substances: Nicotine (3mg /168ml lasts 3 weeks), Flavoring    Devices: Refillable tank   Substance and Sexual Activity    Alcohol use: Not Currently    Drug use: Never    Sexual activity: Yes     Partners: Male     Comment: LMP: 09/30/2021 [id number]     Social Determinants of Health     Financial Resource Strain: Low Risk  (10/30/2021)    Financial Resource Strain     SDOH Financial: No   Transportation Needs: Low Risk  (10/30/2021)    Transportation Needs     SDOH Transportation: No   Social Connections: Low Risk  (10/30/2021)    Social Connections     SDOH Social Isolation: 5 or more times a week   Intimate Partner Violence: Low Risk  (10/30/2021)    Intimate Partner Violence     SDOH Domestic Violence: No   Housing Stability: Low Risk  (10/30/2021)    Housing Stability     SDOH Housing Situation: I have housing.     SDOH Housing Worry: No           Review of Systems:  Any pertinent Review of Systems as addressed in the HPI above.    Physical Exam:  Vital Signs:  Vitals:    12/22/21 0854   BP: 100/70   Pulse: 64   Temp: 37.5 C (99.5 F)   TempSrc: Tympanic   SpO2: 97%   Weight: 77.1 kg (170 lb)   Height: 1.6 m (5\' 3" )   BMI: 30.18     Physical Exam  Constitutional:       Appearance: She is obese.   HENT:      Head: Normocephalic.   Cardiovascular:      Rate and Rhythm: Normal  rate and regular rhythm.      Pulses: Normal pulses.           Radial pulses are 2+ on the right side and 2+ on the left side.      Heart sounds: Normal heart sounds, S1 normal and S2 normal.   Pulmonary:      Effort: Pulmonary effort is normal.      Breath sounds: Normal breath sounds.   Abdominal:      General: Bowel sounds are normal.   Musculoskeletal:      Cervical back: Neck supple.      Right lower leg: No edema.      Left lower leg: No edema.   Skin:     General: Skin is warm.   Neurological:      General: No focal deficit present.      Mental Status: She is alert and oriented to person, place, and time.      Cranial Nerves: Cranial nerves 2-12 are intact.      Coordination: Coordination is intact.      Gait: Gait is intact.   Psychiatric:         Mood and Affect: Mood normal.         Speech: Speech normal.         Behavior: Behavior normal. Behavior is cooperative.         Thought Content: Thought content normal.         Cognition and Memory: Cognition normal.         Judgment: Judgment normal.          Assessment/Plan:  (E03.9) Acquired hypothyroidism  (primary encounter diagnosis)  Plan: CBC, COMPREHENSIVE METABOLIC PNL, FASTING,         INSULIN, SERUM, MAGNESIUM, THYROID STIMULATING         HORMONE (SENSITIVE TSH), VITAMIN B12, VITAMIN D        25 TOTAL, CANCELED: HGA1C (HEMOGLOBIN A1C WITH         EST AVG GLUCOSE), CANCELED: CBC, CANCELED:         COMPREHENSIVE METABOLIC PNL, FASTING, CANCELED:        LIPID PANEL, CANCELED: MAGNESIUM, CANCELED:         THYROID STIMULATING HORMONE (SENSITIVE TSH),         CANCELED: VITAMIN D 25 TOTAL, CANCELED: VITAMIN        B12, CANCELED: INSULIN, SERUM    (K21.9) Gastroesophageal reflux disease without esophagitis  Plan: CBC, COMPREHENSIVE METABOLIC PNL, FASTING,         INSULIN, SERUM, MAGNESIUM, THYROID STIMULATING         HORMONE (SENSITIVE TSH), VITAMIN B12, VITAMIN D        25 TOTAL, CANCELED: HGA1C (HEMOGLOBIN A1C WITH         EST AVG GLUCOSE), CANCELED: CBC,  CANCELED:         COMPREHENSIVE METABOLIC PNL, FASTING, CANCELED:        LIPID PANEL, CANCELED:  MAGNESIUM, CANCELED:         THYROID STIMULATING HORMONE (SENSITIVE TSH),         CANCELED: VITAMIN D 25 TOTAL, CANCELED: VITAMIN        B12, CANCELED: INSULIN, SERUM    (E28.2) PCOS (polycystic ovarian syndrome)  Plan: CBC, COMPREHENSIVE METABOLIC PNL, FASTING,         INSULIN, SERUM, MAGNESIUM, THYROID STIMULATING         HORMONE (SENSITIVE TSH), VITAMIN B12, VITAMIN D        25 TOTAL, CANCELED: HGA1C (HEMOGLOBIN A1C WITH         EST AVG GLUCOSE), CANCELED: CBC, CANCELED:         COMPREHENSIVE METABOLIC PNL, FASTING, CANCELED:        LIPID PANEL, CANCELED: MAGNESIUM, CANCELED:         THYROID STIMULATING HORMONE (SENSITIVE TSH),         CANCELED: VITAMIN D 25 TOTAL, CANCELED: VITAMIN        B12, CANCELED: INSULIN, SERUM    (G43.909) Migraine  Plan: CBC, COMPREHENSIVE METABOLIC PNL, FASTING,         INSULIN, SERUM, MAGNESIUM, THYROID STIMULATING         HORMONE (SENSITIVE TSH), VITAMIN B12, VITAMIN D        25 TOTAL, CANCELED: HGA1C (HEMOGLOBIN A1C WITH         EST AVG GLUCOSE), CANCELED: CBC, CANCELED:         COMPREHENSIVE METABOLIC PNL, FASTING, CANCELED:        LIPID PANEL, CANCELED: MAGNESIUM, CANCELED:         THYROID STIMULATING HORMONE (SENSITIVE TSH),         CANCELED: VITAMIN D 25 TOTAL, CANCELED: VITAMIN        B12, CANCELED: INSULIN, SERUM    (F32.A) Depression  Plan: CBC, COMPREHENSIVE METABOLIC PNL, FASTING,         INSULIN, SERUM, MAGNESIUM, THYROID STIMULATING         HORMONE (SENSITIVE TSH), VITAMIN B12, VITAMIN D        25 TOTAL, CANCELED: HGA1C (HEMOGLOBIN A1C WITH         EST AVG GLUCOSE), CANCELED: CBC, CANCELED:         COMPREHENSIVE METABOLIC PNL, FASTING, CANCELED:        LIPID PANEL, CANCELED: MAGNESIUM, CANCELED:         THYROID STIMULATING HORMONE (SENSITIVE TSH),         CANCELED: VITAMIN D 25 TOTAL, CANCELED: VITAMIN        B12, CANCELED: INSULIN, SERUM    (E53.8) B12 deficiency  Plan: CBC,  COMPREHENSIVE METABOLIC PNL, FASTING,         INSULIN, SERUM, MAGNESIUM, THYROID STIMULATING         HORMONE (SENSITIVE TSH), VITAMIN B12, VITAMIN D        25 TOTAL, CANCELED: HGA1C (HEMOGLOBIN A1C WITH         EST AVG GLUCOSE), CANCELED: CBC, CANCELED:         COMPREHENSIVE METABOLIC PNL, FASTING, CANCELED:        LIPID PANEL, CANCELED: MAGNESIUM, CANCELED:         THYROID STIMULATING HORMONE (SENSITIVE TSH),         CANCELED: VITAMIN D 25 TOTAL, CANCELED: VITAMIN        B12, CANCELED: INSULIN, SERUM    (R73.01) IFG (impaired fasting glucose)  Plan: CBC, COMPREHENSIVE METABOLIC PNL, FASTING,         INSULIN, SERUM, MAGNESIUM,  THYROID STIMULATING         HORMONE (SENSITIVE TSH), VITAMIN B12, VITAMIN D        25 TOTAL, CANCELED: HGA1C (HEMOGLOBIN A1C WITH         EST AVG GLUCOSE), CANCELED: CBC, CANCELED:         COMPREHENSIVE METABOLIC PNL, FASTING, CANCELED:        LIPID PANEL, CANCELED: MAGNESIUM, CANCELED:         THYROID STIMULATING HORMONE (SENSITIVE TSH),         CANCELED: VITAMIN D 25 TOTAL, CANCELED: VITAMIN        B12, CANCELED: INSULIN, SERUM    (E55.9) Vitamin D deficiency  Plan: CBC, COMPREHENSIVE METABOLIC PNL, FASTING,         INSULIN, SERUM, MAGNESIUM, THYROID STIMULATING         HORMONE (SENSITIVE TSH), VITAMIN B12, VITAMIN D        25 TOTAL, CANCELED: HGA1C (HEMOGLOBIN A1C WITH         EST AVG GLUCOSE), CANCELED: CBC, CANCELED:         COMPREHENSIVE METABOLIC PNL, FASTING, CANCELED:        LIPID PANEL, CANCELED: MAGNESIUM, CANCELED:         THYROID STIMULATING HORMONE (SENSITIVE TSH),         CANCELED: VITAMIN D 25 TOTAL, CANCELED: VITAMIN        B12, CANCELED: INSULIN, SERUM    (E66.9) Obesity (BMI 30.0-34.9)  Plan: CBC, COMPREHENSIVE METABOLIC PNL, FASTING,         INSULIN, SERUM, MAGNESIUM, THYROID STIMULATING         HORMONE (SENSITIVE TSH), VITAMIN B12, VITAMIN D        25 TOTAL, CANCELED: HGA1C (HEMOGLOBIN A1C WITH         EST AVG GLUCOSE), CANCELED: CBC, CANCELED:         COMPREHENSIVE  METABOLIC PNL, FASTING, CANCELED:        LIPID PANEL, CANCELED: MAGNESIUM, CANCELED:         THYROID STIMULATING HORMONE (SENSITIVE TSH),         CANCELED: VITAMIN D 25 TOTAL, CANCELED: VITAMIN        B12, CANCELED: INSULIN, SERUM    (E78.00) Pure hypercholesterolemia       Problem List Items Addressed This Visit          Cardiovascular System    Hyperlipidemia     A copy of lipid panel from work wellness screening dated 12/01/2021 presented today.  Lipid panel scanned into chart.  Triglycerides, 271, LDL 160, HDL 99 and total cholesterol 274.  Patient will restart simvastatin 40 mg daily.  Advised to limit high fat foods, processed foods and fast foods in diet.  Recheck in 3 months.            Neurologic    Migraine     Last migraine was 12/21/2021, relieved with 1 dose of Imitrex.  Continue medications as prescribed         Relevant Orders    CBC    COMPREHENSIVE METABOLIC PNL, FASTING    INSULIN, SERUM    MAGNESIUM    THYROID STIMULATING HORMONE (SENSITIVE TSH)    VITAMIN B12    VITAMIN D 25 TOTAL       Digestive    Gastroesophageal reflux disease     Symptoms well controlled.  Continue famotidine 40 mg daily.         Relevant Orders    CBC  COMPREHENSIVE METABOLIC PNL, FASTING    INSULIN, SERUM    MAGNESIUM    THYROID STIMULATING HORMONE (SENSITIVE TSH)    VITAMIN B12    VITAMIN D 25 TOTAL       Endocrine    Acquired hypothyroidism - Primary     Continue levothyroxine 100 mcg daily.  Denies missed doses.  Continue to take levothyroxine on an empty stomach in the morning, wait 30 minutes prior to eating/drinking.         Relevant Orders    CBC    COMPREHENSIVE METABOLIC PNL, FASTING    INSULIN, SERUM    MAGNESIUM    THYROID STIMULATING HORMONE (SENSITIVE TSH)    VITAMIN B12    VITAMIN D 25 TOTAL    PCOS (polycystic ovarian syndrome)     Continue metformin 500 mg b.i.d. and spironolactone 100 mg daily.         Relevant Orders    CBC    COMPREHENSIVE METABOLIC PNL, FASTING    INSULIN, SERUM    MAGNESIUM     THYROID STIMULATING HORMONE (SENSITIVE TSH)    VITAMIN B12    VITAMIN D 25 TOTAL    IFG (impaired fasting glucose)     Continue metformin 500 mg b.i.d.         Relevant Orders    CBC    COMPREHENSIVE METABOLIC PNL, FASTING    INSULIN, SERUM    MAGNESIUM    THYROID STIMULATING HORMONE (SENSITIVE TSH)    VITAMIN B12    VITAMIN D 25 TOTAL    Vitamin D deficiency     Continue vitamin-D 4000 IU daily.         Relevant Orders    CBC    COMPREHENSIVE METABOLIC PNL, FASTING    INSULIN, SERUM    MAGNESIUM    THYROID STIMULATING HORMONE (SENSITIVE TSH)    VITAMIN B12    VITAMIN D 25 TOTAL    B12 deficiency     Continue vitamin B12 1000 mcg daily.         Relevant Orders    CBC    COMPREHENSIVE METABOLIC PNL, FASTING    INSULIN, SERUM    MAGNESIUM    THYROID STIMULATING HORMONE (SENSITIVE TSH)    VITAMIN B12    VITAMIN D 25 TOTAL       Psychiatric    Depression     Denies SI/HI/AVH.  Continue fluoxetine 40 mg daily.  Patient is not interested in counseling or psychology referral this time.  Advised follow up in 3 weeks since patient is complaining of anxiety for the past 3 days.  Advised to follow-up if symptoms progress or do not resolve.  Patient agree with plan of care.         Relevant Orders    CBC    COMPREHENSIVE METABOLIC PNL, FASTING    INSULIN, SERUM    MAGNESIUM    THYROID STIMULATING HORMONE (SENSITIVE TSH)    VITAMIN B12    VITAMIN D 25 TOTAL       Other    Obesity (BMI 30.0-34.9)     Discussed diet and exercise.         Relevant Orders    CBC    COMPREHENSIVE METABOLIC PNL, FASTING    INSULIN, SERUM    MAGNESIUM    THYROID STIMULATING HORMONE (SENSITIVE TSH)    VITAMIN B12    VITAMIN D 25 TOTAL      Labs drawn today.  Continue current medications.  Advised a low-fat/low sodium diet, advised 150 minutes of scheduled weekly physical activity as tolerated.  Advised to maintain a healthy weight.   Return in about 3 weeks (around 01/12/2022) for 3 weeks for anxiety recheck; 3 months for routine visit.    Jodi Mourning, NP-C     Portions of this note may be dictated using voice recognition software or a dictation service. Variances in spelling and vocabulary are possible and unintentional. Not all errors are caught/corrected. Please notify the [facility] if any discrepancies are noted or if the meaning of any statement is not clear.

## 2021-12-22 NOTE — Assessment & Plan Note (Addendum)
Continue metformin 500 mg b.i.d. and spironolactone 100 mg daily.

## 2021-12-22 NOTE — Assessment & Plan Note (Signed)
Continue vitamin-D 4000 IU daily

## 2021-12-22 NOTE — Assessment & Plan Note (Signed)
Discussed diet and exercise 

## 2021-12-22 NOTE — Assessment & Plan Note (Signed)
Continue vitamin B12 1000 mcg daily.

## 2022-01-05 ENCOUNTER — Encounter (RURAL_HEALTH_CENTER): Payer: Self-pay | Admitting: Family

## 2022-01-16 ENCOUNTER — Ambulatory Visit (RURAL_HEALTH_CENTER): Payer: Self-pay | Admitting: Family

## 2022-02-22 ENCOUNTER — Other Ambulatory Visit (RURAL_HEALTH_CENTER): Payer: Self-pay | Admitting: Family

## 2022-03-02 DIAGNOSIS — F419 Anxiety disorder, unspecified: Secondary | ICD-10-CM | POA: Insufficient documentation

## 2022-03-23 ENCOUNTER — Other Ambulatory Visit: Payer: Self-pay

## 2022-03-23 ENCOUNTER — Encounter (RURAL_HEALTH_CENTER): Payer: Self-pay | Admitting: Family

## 2022-03-23 ENCOUNTER — Ambulatory Visit (RURAL_HEALTH_CENTER): Payer: Medicare (Managed Care) | Attending: Family | Admitting: Family

## 2022-03-23 ENCOUNTER — Telehealth (RURAL_HEALTH_CENTER): Payer: Self-pay | Admitting: Family

## 2022-03-23 VITALS — BP 111/75 | HR 66 | Temp 98.7°F | Resp 18 | Ht 63.0 in | Wt 178.6 lb

## 2022-03-23 DIAGNOSIS — E559 Vitamin D deficiency, unspecified: Secondary | ICD-10-CM | POA: Insufficient documentation

## 2022-03-23 DIAGNOSIS — Z6831 Body mass index (BMI) 31.0-31.9, adult: Secondary | ICD-10-CM | POA: Insufficient documentation

## 2022-03-23 DIAGNOSIS — F32A Depression, unspecified: Secondary | ICD-10-CM | POA: Insufficient documentation

## 2022-03-23 DIAGNOSIS — G4700 Insomnia, unspecified: Secondary | ICD-10-CM | POA: Insufficient documentation

## 2022-03-23 DIAGNOSIS — K219 Gastro-esophageal reflux disease without esophagitis: Secondary | ICD-10-CM | POA: Insufficient documentation

## 2022-03-23 DIAGNOSIS — E039 Hypothyroidism, unspecified: Secondary | ICD-10-CM | POA: Insufficient documentation

## 2022-03-23 DIAGNOSIS — E785 Hyperlipidemia, unspecified: Secondary | ICD-10-CM | POA: Insufficient documentation

## 2022-03-23 DIAGNOSIS — E669 Obesity, unspecified: Secondary | ICD-10-CM | POA: Insufficient documentation

## 2022-03-23 DIAGNOSIS — E282 Polycystic ovarian syndrome: Secondary | ICD-10-CM | POA: Insufficient documentation

## 2022-03-23 DIAGNOSIS — E538 Deficiency of other specified B group vitamins: Secondary | ICD-10-CM | POA: Insufficient documentation

## 2022-03-23 DIAGNOSIS — R7301 Impaired fasting glucose: Secondary | ICD-10-CM | POA: Insufficient documentation

## 2022-03-23 DIAGNOSIS — F17290 Nicotine dependence, other tobacco product, uncomplicated: Secondary | ICD-10-CM | POA: Insufficient documentation

## 2022-03-23 MED ORDER — FAMOTIDINE 40 MG TABLET
40.0000 mg | ORAL_TABLET | Freq: Every day | ORAL | 1 refills | Status: AC
Start: 2022-03-23 — End: ?

## 2022-03-23 MED ORDER — FLUOXETINE 40 MG CAPSULE
40.0000 mg | ORAL_CAPSULE | Freq: Every day | ORAL | 1 refills | Status: AC
Start: 2022-03-23 — End: ?

## 2022-03-23 MED ORDER — NORETHINDRONE 1 MG-ETHINYL ESTRADIOL 10 MCG (24)-IRON 10 MCG(2) TABLET
1.0000 | ORAL_TABLET | Freq: Every day | ORAL | 1 refills | Status: DC
Start: 2022-03-23 — End: 2022-04-12

## 2022-03-23 MED ORDER — METFORMIN ER 500 MG TABLET,EXTENDED RELEASE 24 HR
500.0000 mg | ORAL_TABLET | Freq: Two times a day (BID) | ORAL | 1 refills | Status: DC
Start: 2022-03-23 — End: 2022-04-12

## 2022-03-23 MED ORDER — LEVOTHYROXINE 100 MCG TABLET
100.0000 ug | ORAL_TABLET | Freq: Every day | ORAL | 1 refills | Status: AC
Start: 2022-03-23 — End: ?

## 2022-03-23 MED ORDER — TRAZODONE 50 MG TABLET
50.0000 mg | ORAL_TABLET | Freq: Every evening | ORAL | 1 refills | Status: AC
Start: 2022-03-23 — End: 2022-04-22

## 2022-03-23 MED ORDER — CETIRIZINE 10 MG TABLET
10.0000 mg | ORAL_TABLET | Freq: Every day | ORAL | 1 refills | Status: AC
Start: 2022-03-23 — End: ?

## 2022-03-23 MED ORDER — SIMVASTATIN 40 MG TABLET
40.0000 mg | ORAL_TABLET | Freq: Every day | ORAL | 1 refills | Status: AC
Start: 2022-03-23 — End: ?

## 2022-03-23 MED ORDER — SPIRONOLACTONE 100 MG TABLET
100.0000 mg | ORAL_TABLET | Freq: Every day | ORAL | 1 refills | Status: AC
Start: 2022-03-23 — End: ?

## 2022-03-23 NOTE — Assessment & Plan Note (Signed)
Continue simvastatin 40 mg daily.  Advised to limit high fat foods processed foods and fast foods in diet.

## 2022-03-23 NOTE — Assessment & Plan Note (Signed)
Continue vitamin B12 1000 mcg daily.

## 2022-03-23 NOTE — Progress Notes (Signed)
FAMILY MEDICINE, [place]  [place] [id number]  Operated by [facility]     Name: Nichole Suarez MRN:  [id number]   Date of Birth: [birth year] Age: 42 y.o.   Date: 03/23/2022  Time: 14:23     Provider: Jodi Mourning, NP-C    Reason for visit: Follow Up 3 Months      History of Present Illness:  Nichole Suarez is a 42 y.o. female presenting with chronic disease management.      Patient reports she has been struggling with insomnia.  Patient reports she does not understand why.  Denies major life events.  Patient reports work is going well.  Patient states when she does go to sleep she is having vivid dreams which awakened her and she has a difficult time falling back asleep.  Patient states she feels very exhausted.  Patient states she is tried some over-the-counter medications without relief of symptoms.  Patient states she does try to stay on a scheduled, goes to bed between 9 and 10, per 1st to sleep 7 or 8 hours which has always been normal for her.  Patient states she has not had 7 or 8 hours as in months.      Patient Active Problem List    Diagnosis Date Noted    Insomnia 03/23/2022    Hyperlipidemia 12/22/2021    Migraine 09/15/2021    Acquired hypothyroidism 06/14/2021    Gastroesophageal reflux disease 06/14/2021    Depression 06/14/2021    PCOS (polycystic ovarian syndrome) 06/14/2021    IFG (impaired fasting glucose) 06/14/2021     Managed by Dr. Mercie Eon, GYN.       Vitamin D deficiency 06/14/2021    B12 deficiency 06/14/2021    Obesity (BMI 30.0-34.9) 06/14/2021       Historical Data    Past Medical History:  Past Medical History:   Diagnosis Date    Benign hypertension     Chronic diarrhea of unknown origin     Dysmenorrhea     Dyspnea     Encounter for screening for malignant neoplasm of breast     Ex-smoker     Fatigue     Hyperinsulinemia     Hyponatremia     Insomnia     Low back pain     Lower urinary tract infectious  disease     Morbid obesity (CMS HCC)     Muscle fatigue     Obstructive sleep apnea syndrome     Paresthesia and pain of both upper extremities     Vitamin B 12 deficiency      Past Surgical History:  Past Surgical History:   Procedure Laterality Date    HX LASER EYE SURGERY  2008    TONSILLECTOMY  04/23/2019     Allergies:  Allergies   Allergen Reactions    Amoxicillin  Other Adverse Reaction (Add comment)     Abdominal pain    Clavulanic Acid  Other Adverse Reaction (Add comment)     Abdominal pain      Lisinopril  Other Adverse Reaction (Add comment)     Abdominal Pain     Medications:  Current Outpatient Medications   Medication Sig    cetirizine (ZYRTEC) 10 mg Oral Tablet Take 1 Tablet (10 mg total) by mouth Once a day    famotidine (PEPCID) 40 mg Oral Tablet Take 1 Tablet (40 mg total) by mouth Once a day  FLUoxetine (PROZAC) 40 mg Oral Capsule Take 1 Capsule (40 mg total) by mouth Once a day    levothyroxine (SYNTHROID) 100 mcg Oral Tablet Take 1 Tablet (100 mcg total) by mouth Once a day    metFORMIN (GLUCOPHAGE XR) 500 mg Oral Tablet Sustained Release 24 hr Take 1 Tablet (500 mg total) by mouth Twice daily    norethindrone-e.estradioL-iron (equiv to: LoLOESTRIN Fe) 1 mg-10 mcg (24)/10 mcg (2) Oral Tablet Take 1 Tablet by mouth Once a day    simvastatin (ZOCOR) 40 mg Oral Tablet Take 1 Tablet (40 mg total) by mouth Once a day    spironolactone (ALDACTONE) 100 mg Oral Tablet Take 1 Tablet (100 mg total) by mouth Once a day    traZODone (DESYREL) 50 mg Oral Tablet Take 1 Tablet (50 mg total) by mouth Every night for 30 days     Family History:  Family Medical History:       Problem Relation (Age of Onset)    Bipolar Disorder Sister    Diabetes type II Father    Heart Attack Father    Hypothyroidism Father    Iron deficiency Mother    No Known Problems Brother, Maternal Grandmother, Maternal Grandfather, Paternal Grandmother, Paternal Grandfather, Daughter, Son, Maternal Aunt, Maternal Uncle, Paternal 14,  Paternal Uncle, Half-Brother, Half-Sister    Prostate Cancer Father    Stroke Father            Social History:  Social History     Socioeconomic History    Marital status: Married     Spouse name: Vonna Kotyk    Number of children: 0    Years of education: 12+   Occupational History     Comment: [employer].     Comment: Employeed; [employer]   Tobacco Use    Smoking status: Former     Packs/day: 0.50     Years: 18.00     Additional pack years: 0.00     Total pack years: 9.00     Types: Cigarettes     Start date: 1999     Quit date: 2017     Years since quitting: 7.1    Smokeless tobacco: Never   Vaping Use    Vaping Use: Every day    Substances: Nicotine (73m/100ml lasts 3 weeks), Flavoring    Devices: Refillable tank   Substance and Sexual Activity    Alcohol use: Not Currently    Drug use: Never    Sexual activity: Yes     Partners: Male     Comment: LMP: 03/13/2022 [id number]     Social Determinants of Health     Financial Resource Strain: Low Risk  (03/23/2022)    Financial Resource Strain     SDOH Financial: No   Transportation Needs: Low Risk  (03/23/2022)    Transportation Needs     SDOH Transportation: No   Social Connections: Low Risk  (03/23/2022)    Social Connections     SDOH Social Isolation: 5 or more times a week   Intimate Partner Violence: [place] (03/23/2022)    Intimate Partner Violence     SDOH Domestic Violence: No   Housing Stability: Low Risk  (03/23/2022)    Housing Stability     SDOH Housing Situation: I have housing.     SDOH Housing Worry: No           Review of Systems:  Any pertinent Review of Systems as addressed in the HPI  above.    Physical Exam:  Vital Signs:  Vitals:    03/23/22 0807   BP: 111/75   Pulse: 66   Resp: 18   Temp: 37.1 C (98.7 F)   SpO2: 99%   Weight: 81 kg (178 lb 9.6 oz)   Height: 1.6 m (5' 3"$ )   BMI: 31.7     Physical Exam  Constitutional:       Appearance: She is obese.   HENT:      Head: Normocephalic.   Cardiovascular:      Rate and Rhythm: Normal rate and  regular rhythm.      Pulses: Normal pulses.           Radial pulses are 2+ on the right side and 2+ on the left side.      Heart sounds: Normal heart sounds, S1 normal and S2 normal.   Pulmonary:      Effort: Pulmonary effort is normal.      Breath sounds: Normal breath sounds.   Abdominal:      General: Bowel sounds are normal.   Musculoskeletal:      Cervical back: Neck supple.      Right lower leg: No edema.      Left lower leg: No edema.   Skin:     General: Skin is warm.   Neurological:      General: No focal deficit present.      Mental Status: She is alert and oriented to person, place, and time.      Cranial Nerves: Cranial nerves 2-12 are intact.      Coordination: Coordination is intact.      Gait: Gait is intact.   Psychiatric:         Mood and Affect: Mood normal.         Speech: Speech normal.         Behavior: Behavior normal. Behavior is cooperative.         Thought Content: Thought content normal.         Cognition and Memory: Cognition normal.         Judgment: Judgment normal.          Assessment/Plan:  (E78.5) Hyperlipidemia  (primary encounter diagnosis)  Plan: HGA1C (HEMOGLOBIN A1C WITH EST AVG GLUCOSE),         INSULIN, SERUM, CBC, COMPREHENSIVE METABOLIC         PNL, FASTING, VITAMIN D 25 TOTAL, VITAMIN B12,         THYROID STIMULATING HORMONE (SENSITIVE TSH),         MICROALBUMIN/CREATININE RATIO, URINE, RANDOM,         MAGNESIUM, LIPID PANEL, CANCELED: HGA1C         (HEMOGLOBIN A1C WITH EST AVG GLUCOSE),         CANCELED: MICROALBUMIN/CREATININE RATIO, URINE,        RANDOM, CANCELED: CBC, CANCELED: COMPREHENSIVE         METABOLIC PNL, FASTING, CANCELED: LIPID PANEL,         CANCELED: MAGNESIUM, CANCELED: THYROID         STIMULATING HORMONE (SENSITIVE TSH), CANCELED:         VITAMIN D 25 TOTAL, CANCELED: VITAMIN B12,         CANCELED: INSULIN, SERUM    (E03.9) Acquired hypothyroidism  Plan: HGA1C (HEMOGLOBIN A1C WITH EST AVG GLUCOSE),         INSULIN, SERUM, CBC, COMPREHENSIVE METABOLIC  PNL, FASTING, VITAMIN D 25 TOTAL, VITAMIN B12,         THYROID STIMULATING HORMONE (SENSITIVE TSH),         MICROALBUMIN/CREATININE RATIO, URINE, RANDOM,         MAGNESIUM, LIPID PANEL, CANCELED: HGA1C         (HEMOGLOBIN A1C WITH EST AVG GLUCOSE),         CANCELED: MICROALBUMIN/CREATININE RATIO, URINE,        RANDOM, CANCELED: CBC, CANCELED: COMPREHENSIVE         METABOLIC PNL, FASTING, CANCELED: LIPID PANEL,         CANCELED: MAGNESIUM, CANCELED: THYROID         STIMULATING HORMONE (SENSITIVE TSH), CANCELED:         VITAMIN D 25 TOTAL, CANCELED: VITAMIN B12,         CANCELED: INSULIN, SERUM    (K21.9) Gastroesophageal reflux disease without esophagitis  Plan: HGA1C (HEMOGLOBIN A1C WITH EST AVG GLUCOSE),         INSULIN, SERUM, CBC, COMPREHENSIVE METABOLIC         PNL, FASTING, VITAMIN D 25 TOTAL, VITAMIN B12,         THYROID STIMULATING HORMONE (SENSITIVE TSH),         MICROALBUMIN/CREATININE RATIO, URINE, RANDOM,         MAGNESIUM, LIPID PANEL, CANCELED: HGA1C         (HEMOGLOBIN A1C WITH EST AVG GLUCOSE),         CANCELED: MICROALBUMIN/CREATININE RATIO, URINE,        RANDOM, CANCELED: CBC, CANCELED: COMPREHENSIVE         METABOLIC PNL, FASTING, CANCELED: LIPID PANEL,         CANCELED: MAGNESIUM, CANCELED: THYROID         STIMULATING HORMONE (SENSITIVE TSH), CANCELED:         VITAMIN D 25 TOTAL, CANCELED: VITAMIN B12,         CANCELED: INSULIN, SERUM    (E28.2) PCOS (polycystic ovarian syndrome)  Plan: HGA1C (HEMOGLOBIN A1C WITH EST AVG GLUCOSE),         INSULIN, SERUM, CBC, COMPREHENSIVE METABOLIC         PNL, FASTING, VITAMIN D 25 TOTAL, VITAMIN B12,         THYROID STIMULATING HORMONE (SENSITIVE TSH),         MICROALBUMIN/CREATININE RATIO, URINE, RANDOM,         MAGNESIUM, LIPID PANEL, CANCELED: HGA1C         (HEMOGLOBIN A1C WITH EST AVG GLUCOSE),         CANCELED: MICROALBUMIN/CREATININE RATIO, URINE,        RANDOM, CANCELED: CBC, CANCELED: COMPREHENSIVE         METABOLIC PNL, FASTING, CANCELED: LIPID  PANEL,         CANCELED: MAGNESIUM, CANCELED: THYROID         STIMULATING HORMONE (SENSITIVE TSH), CANCELED:         VITAMIN D 25 TOTAL, CANCELED: VITAMIN B12,         CANCELED: INSULIN, SERUM    (R73.01) IFG (impaired fasting glucose)  Plan: HGA1C (HEMOGLOBIN A1C WITH EST AVG GLUCOSE),         INSULIN, SERUM, CBC, COMPREHENSIVE METABOLIC         PNL, FASTING, VITAMIN D 25 TOTAL, VITAMIN B12,         THYROID STIMULATING HORMONE (SENSITIVE TSH),         MICROALBUMIN/CREATININE RATIO, URINE, RANDOM,  MAGNESIUM, LIPID PANEL, CANCELED: HGA1C         (HEMOGLOBIN A1C WITH EST AVG GLUCOSE),         CANCELED: MICROALBUMIN/CREATININE RATIO, URINE,        RANDOM, CANCELED: CBC, CANCELED: COMPREHENSIVE         METABOLIC PNL, FASTING, CANCELED: LIPID PANEL,         CANCELED: MAGNESIUM, CANCELED: THYROID         STIMULATING HORMONE (SENSITIVE TSH), CANCELED:         VITAMIN D 25 TOTAL, CANCELED: VITAMIN B12,         CANCELED: INSULIN, SERUM    (F32.A) Depression  Plan: HGA1C (HEMOGLOBIN A1C WITH EST AVG GLUCOSE),         INSULIN, SERUM, CBC, COMPREHENSIVE METABOLIC         PNL, FASTING, VITAMIN D 25 TOTAL, VITAMIN B12,         THYROID STIMULATING HORMONE (SENSITIVE TSH),         MICROALBUMIN/CREATININE RATIO, URINE, RANDOM,         MAGNESIUM, LIPID PANEL, CANCELED: HGA1C         (HEMOGLOBIN A1C WITH EST AVG GLUCOSE),         CANCELED: MICROALBUMIN/CREATININE RATIO, URINE,        RANDOM, CANCELED: CBC, CANCELED: COMPREHENSIVE         METABOLIC PNL, FASTING, CANCELED: LIPID PANEL,         CANCELED: MAGNESIUM, CANCELED: THYROID         STIMULATING HORMONE (SENSITIVE TSH), CANCELED:         VITAMIN D 25 TOTAL, CANCELED: VITAMIN B12,         CANCELED: INSULIN, SERUM    (E66.9) Obesity (BMI 30.0-34.9)  Plan: HGA1C (HEMOGLOBIN A1C WITH EST AVG GLUCOSE),         INSULIN, SERUM, CBC, COMPREHENSIVE METABOLIC         PNL, FASTING, VITAMIN D 25 TOTAL, VITAMIN B12,         THYROID STIMULATING HORMONE (SENSITIVE TSH),          MICROALBUMIN/CREATININE RATIO, URINE, RANDOM,         MAGNESIUM, LIPID PANEL, CANCELED: HGA1C         (HEMOGLOBIN A1C WITH EST AVG GLUCOSE),         CANCELED: MICROALBUMIN/CREATININE RATIO, URINE,        RANDOM, CANCELED: CBC, CANCELED: COMPREHENSIVE         METABOLIC PNL, FASTING, CANCELED: LIPID PANEL,         CANCELED: MAGNESIUM, CANCELED: THYROID         STIMULATING HORMONE (SENSITIVE TSH), CANCELED:         VITAMIN D 25 TOTAL, CANCELED: VITAMIN B12,         CANCELED: INSULIN, SERUM    (E55.9) Vitamin D deficiency  Plan: HGA1C (HEMOGLOBIN A1C WITH EST AVG GLUCOSE),         INSULIN, SERUM, CBC, COMPREHENSIVE METABOLIC         PNL, FASTING, VITAMIN D 25 TOTAL, VITAMIN B12,         THYROID STIMULATING HORMONE (SENSITIVE TSH),         MICROALBUMIN/CREATININE RATIO, URINE, RANDOM,         MAGNESIUM, LIPID PANEL, CANCELED: HGA1C         (HEMOGLOBIN A1C WITH EST AVG GLUCOSE),         CANCELED: MICROALBUMIN/CREATININE RATIO, URINE,        RANDOM, CANCELED: CBC, CANCELED: COMPREHENSIVE  METABOLIC PNL, FASTING, CANCELED: LIPID PANEL,         CANCELED: MAGNESIUM, CANCELED: THYROID         STIMULATING HORMONE (SENSITIVE TSH), CANCELED:         VITAMIN D 25 TOTAL, CANCELED: VITAMIN B12,         CANCELED: INSULIN, SERUM    (E53.8) B12 deficiency  Plan: HGA1C (HEMOGLOBIN A1C WITH EST AVG GLUCOSE),         INSULIN, SERUM, CBC, COMPREHENSIVE METABOLIC         PNL, FASTING, VITAMIN D 25 TOTAL, VITAMIN B12,         THYROID STIMULATING HORMONE (SENSITIVE TSH),         MICROALBUMIN/CREATININE RATIO, URINE, RANDOM,         MAGNESIUM, LIPID PANEL, CANCELED: HGA1C         (HEMOGLOBIN A1C WITH EST AVG GLUCOSE),         CANCELED: MICROALBUMIN/CREATININE RATIO, URINE,        RANDOM, CANCELED: CBC, CANCELED: COMPREHENSIVE         METABOLIC PNL, FASTING, CANCELED: LIPID PANEL,         CANCELED: MAGNESIUM, CANCELED: THYROID         STIMULATING HORMONE (SENSITIVE TSH), CANCELED:         VITAMIN D 25 TOTAL, CANCELED: VITAMIN B12,          CANCELED: INSULIN, SERUM    (G47.00) Insomnia       Problem List Items Addressed This Visit          Cardiovascular System    Hyperlipidemia - Primary     Continue simvastatin 40 mg daily.  Advised to limit high fat foods processed foods and fast foods in diet.         Relevant Orders    HGA1C (HEMOGLOBIN A1C WITH EST AVG GLUCOSE)    INSULIN, SERUM    CBC    COMPREHENSIVE METABOLIC PNL, FASTING    VITAMIN D 25 TOTAL    VITAMIN B12    THYROID STIMULATING HORMONE (SENSITIVE TSH)    MICROALBUMIN/CREATININE RATIO, URINE, RANDOM    MAGNESIUM    LIPID PANEL       Neurologic    Insomnia     Start trazodone 25 mg q.h.s. for insomnia.  Discussed sleep hygiene.  Patient may increase to 50 mg after 2 weeks, if she is not able to sleep on the 89m mg dose.  Patient verbalized understanding and agree with plan of care.            Digestive    Gastroesophageal reflux disease     Continue current medications, symptoms well controlled.         Relevant Orders    HGA1C (HEMOGLOBIN A1C WITH EST AVG GLUCOSE)    INSULIN, SERUM    CBC    COMPREHENSIVE METABOLIC PNL, FASTING    VITAMIN D 25 TOTAL    VITAMIN B12    THYROID STIMULATING HORMONE (SENSITIVE TSH)    MICROALBUMIN/CREATININE RATIO, URINE, RANDOM    MAGNESIUM    LIPID PANEL       Endocrine    Acquired hypothyroidism     Continue levothyroxine 100 mcg daily.  Advised to take levothyroxine in the morning on an empty stomach and wait 30 minutes prior to eating/drinking.  Denies missed doses.         Relevant Orders    HGA1C (HEMOGLOBIN A1C WITH EST AVG GLUCOSE)    INSULIN, SERUM  CBC    COMPREHENSIVE METABOLIC PNL, FASTING    VITAMIN D 25 TOTAL    VITAMIN B12    THYROID STIMULATING HORMONE (SENSITIVE TSH)    MICROALBUMIN/CREATININE RATIO, URINE, RANDOM    MAGNESIUM    LIPID PANEL    PCOS (polycystic ovarian syndrome)     Continue spironolactone 100 mg daily.         Relevant Orders    HGA1C (HEMOGLOBIN A1C WITH EST AVG GLUCOSE)    INSULIN, SERUM    CBC    COMPREHENSIVE METABOLIC  PNL, FASTING    VITAMIN D 25 TOTAL    VITAMIN B12    THYROID STIMULATING HORMONE (SENSITIVE TSH)    MICROALBUMIN/CREATININE RATIO, URINE, RANDOM    MAGNESIUM    LIPID PANEL    IFG (impaired fasting glucose)    Relevant Orders    HGA1C (HEMOGLOBIN A1C WITH EST AVG GLUCOSE)    INSULIN, SERUM    CBC    COMPREHENSIVE METABOLIC PNL, FASTING    VITAMIN D 25 TOTAL    VITAMIN B12    THYROID STIMULATING HORMONE (SENSITIVE TSH)    MICROALBUMIN/CREATININE RATIO, URINE, RANDOM    MAGNESIUM    LIPID PANEL    Vitamin D deficiency     Patient is no longer taking vitamin-D supplements, further treatment pending labs         Relevant Orders    HGA1C (HEMOGLOBIN A1C WITH EST AVG GLUCOSE)    INSULIN, SERUM    CBC    COMPREHENSIVE METABOLIC PNL, FASTING    VITAMIN D 25 TOTAL    VITAMIN B12    THYROID STIMULATING HORMONE (SENSITIVE TSH)    MICROALBUMIN/CREATININE RATIO, URINE, RANDOM    MAGNESIUM    LIPID PANEL    B12 deficiency     Continue vitamin B12 1000 mcg daily.         Relevant Orders    HGA1C (HEMOGLOBIN A1C WITH EST AVG GLUCOSE)    INSULIN, SERUM    CBC    COMPREHENSIVE METABOLIC PNL, FASTING    VITAMIN D 25 TOTAL    VITAMIN B12    THYROID STIMULATING HORMONE (SENSITIVE TSH)    MICROALBUMIN/CREATININE RATIO, URINE, RANDOM    MAGNESIUM    LIPID PANEL       Psychiatric    Depression     Continue Prozac 40 mg daily.  Denies SI/HI/AVH.  Patient declined psychiatry/psychology referral at this time.         Relevant Orders    HGA1C (HEMOGLOBIN A1C WITH EST AVG GLUCOSE)    INSULIN, SERUM    CBC    COMPREHENSIVE METABOLIC PNL, FASTING    VITAMIN D 25 TOTAL    VITAMIN B12    THYROID STIMULATING HORMONE (SENSITIVE TSH)    MICROALBUMIN/CREATININE RATIO, URINE, RANDOM    MAGNESIUM    LIPID PANEL       Other    Obesity (BMI 30.0-34.9)     Managing with diet and exercise         Relevant Orders    HGA1C (HEMOGLOBIN A1C WITH EST AVG GLUCOSE)    INSULIN, SERUM    CBC    COMPREHENSIVE METABOLIC PNL, FASTING    VITAMIN D 25 TOTAL    VITAMIN B12     THYROID STIMULATING HORMONE (SENSITIVE TSH)    MICROALBUMIN/CREATININE RATIO, URINE, RANDOM    MAGNESIUM    LIPID PANEL      Labs drawn today.  Continue current medications.  Advised a  low-fat/low sodium diet, advised 150 minutes of scheduled weekly physical activity as tolerated.  Advised to maintain a healthy weight.   Return in about 3 months (around 06/21/2022) for routine visit; schedule adult wellness visit.    Jodi Mourning, NP-C     Portions of this note may be dictated using voice recognition software or a dictation service. Variances in spelling and vocabulary are possible and unintentional. Not all errors are caught/corrected. Please notify the [facility] if any discrepancies are noted or if the meaning of any statement is not clear.

## 2022-03-23 NOTE — Assessment & Plan Note (Signed)
Continue levothyroxine 100 mcg daily.  Advised to take levothyroxine in the morning on an empty stomach and wait 30 minutes prior to eating/drinking.  Denies missed doses.

## 2022-03-23 NOTE — Assessment & Plan Note (Signed)
Start trazodone 25 mg q.h.s. for insomnia.  Discussed sleep hygiene.  Patient may increase to 50 mg after 2 weeks, if she is not able to sleep on the 57m mg dose.  Patient verbalized understanding and agree with plan of care.

## 2022-03-23 NOTE — Assessment & Plan Note (Signed)
Patient is no longer taking vitamin-D supplements, further treatment pending labs

## 2022-03-23 NOTE — Assessment & Plan Note (Signed)
Continue current medications, symptoms well controlled.

## 2022-03-23 NOTE — Assessment & Plan Note (Signed)
Continue Prozac 40 mg daily.  Denies SI/HI/AVH.  Patient declined psychiatry/psychology referral at this time.

## 2022-03-23 NOTE — Assessment & Plan Note (Signed)
Managing with diet and exercise.

## 2022-03-23 NOTE — Assessment & Plan Note (Signed)
Continue spironolactone 100 mg daily.

## 2022-04-12 ENCOUNTER — Other Ambulatory Visit: Payer: Self-pay

## 2022-04-12 ENCOUNTER — Encounter (RURAL_HEALTH_CENTER): Payer: Self-pay | Admitting: Family

## 2022-04-12 ENCOUNTER — Ambulatory Visit (RURAL_HEALTH_CENTER): Payer: Medicare (Managed Care) | Attending: Family | Admitting: Family

## 2022-04-12 VITALS — BP 133/80 | HR 75 | Resp 18 | Ht 63.0 in | Wt 176.0 lb

## 2022-04-12 DIAGNOSIS — D75839 Thrombocytosis, unspecified: Secondary | ICD-10-CM | POA: Insufficient documentation

## 2022-04-12 DIAGNOSIS — Z7984 Long term (current) use of oral hypoglycemic drugs: Secondary | ICD-10-CM | POA: Insufficient documentation

## 2022-04-12 DIAGNOSIS — E282 Polycystic ovarian syndrome: Secondary | ICD-10-CM | POA: Insufficient documentation

## 2022-04-12 DIAGNOSIS — R1033 Periumbilical pain: Secondary | ICD-10-CM | POA: Insufficient documentation

## 2022-04-12 LAB — POCT URINE DIPSTICK
BILIRUBIN: NEGATIVE
BLOOD: NEGATIVE
GLUCOSE: NEGATIVE
KETONE: NEGATIVE
NITRITE: NEGATIVE
PH: 6.5
PROTEIN: NEGATIVE
SPECIFIC GRAVITY: 1.005
UROBILINOGEN: 0.2

## 2022-04-12 LAB — POCT URINE HCG: PREGNANCY, URINE: NEGATIVE

## 2022-04-12 MED ORDER — METFORMIN ER 750 MG TABLET,EXTENDED RELEASE 24 HR
750.0000 mg | ORAL_TABLET | Freq: Every evening | ORAL | 1 refills | Status: AC
Start: 2022-04-12 — End: 2022-07-11

## 2022-04-12 NOTE — Progress Notes (Signed)
[facility]      Jasmia Angst  MRN: [id number]  DOB: 1980/12/15  Date of Service: 04/12/2022    CHIEF COMPLAINT  Chief Complaint   Patient presents with    Feel Sick     Stomach issues, bad discomfort, PCOS       SUBJECTIVE  Ayjah Suarez is a 42 y.o. female who presents to clinic to discuss labs and complains of intermittent episodes of abdominal pain for the past 2 weeks.   Patient described pain as feeling like a constricting band around her stomach. Reports symptoms last 2-3 hours and then resolve on their own.  History of GERD and PCOS.  Patient reports she had a period of time last year which she experienced the same symptoms for few months. Patient reports this morning episode lasted 3 hours, reports she just woke up.  Reports once symptoms resolved, she was able to eat.  Symptoms did not return after eating.      Denies fever, nausea, vomiting or diarrhea.  Denies tenderness to touch.  Denies trauma or injury.  Denies belching or GERD symptoms.  Denies dysuria or hematuria.  Reports band like cessation extends around the entire anterior abdomen.    Denies fever, cough, nausea, vomiting or diarrhea.  Denies recent medication changes.      Review of Systems:  Positive ROS discussed in HPI, otherwise all other systems negative.      Medications:   cetirizine (ZYRTEC) 10 mg Oral Tablet, Take 1 Tablet (10 mg total) by mouth Once a day  famotidine (PEPCID) 40 mg Oral Tablet, Take 1 Tablet (40 mg total) by mouth Once a day  FLUoxetine (PROZAC) 40 mg Oral Capsule, Take 1 Capsule (40 mg total) by mouth Once a day  levothyroxine (SYNTHROID) 100 mcg Oral Tablet, Take 1 Tablet (100 mcg total) by mouth Once a day  simvastatin (ZOCOR) 40 mg Oral Tablet, Take 1 Tablet (40 mg total) by mouth Once a day  spironolactone (ALDACTONE) 100 mg Oral Tablet, Take 1 Tablet (100 mg total) by mouth Once a day  SUMAtriptan (IMITREX) 25 mg Oral Tablet,   traZODone (DESYREL) 50 mg  Oral Tablet, Take 1 Tablet (50 mg total) by mouth Every night for 30 days  cyanocobalamin (VITAMIN B12) 1,000 mcg/mL Injection Solution,   metFORMIN (GLUCOPHAGE XR) 500 mg Oral Tablet Sustained Release 24 hr, Take 1 Tablet (500 mg total) by mouth Twice daily  norethindrone-e.estradioL-iron (equiv to: LoLOESTRIN Fe) 1 mg-10 mcg (24)/10 mcg (2) Oral Tablet, Take 1 Tablet by mouth Once a day    No facility-administered medications prior to visit.      Allergies:   Allergies   Allergen Reactions    Amoxicillin  Other Adverse Reaction (Add comment)     Abdominal pain    Clavulanic Acid  Other Adverse Reaction (Add comment)     Abdominal pain      Lisinopril  Other Adverse Reaction (Add comment)     Abdominal Pain         OBJECTIVE  BP 133/80   Pulse 75   Resp 18   Ht 1.6 m (5\' 3" )   Wt 79.8 kg (176 lb)   SpO2 99%   BMI 31.18 kg/m       Physical Exam  Vitals reviewed.   Constitutional:       Appearance: She is obese.   Eyes:      General: Lids are normal. Lids are everted, no  foreign bodies appreciated. Vision grossly intact. Gaze aligned appropriately.      Extraocular Movements: Extraocular movements intact.      Conjunctiva/sclera: Conjunctivae normal.      Pupils: Pupils are equal, round, and reactive to light.   Neck:      Trachea: Trachea and phonation normal.   Cardiovascular:      Rate and Rhythm: Normal rate and regular rhythm.      Pulses:           Radial pulses are 2+ on the right side and 2+ on the left side.        Dorsalis pedis pulses are 2+ on the right side and 2+ on the left side.      Heart sounds: Normal heart sounds, S1 normal and S2 normal.   Pulmonary:      Effort: Pulmonary effort is normal.      Breath sounds: Normal breath sounds.   Chest:   Breasts:     Right: Normal.      Left: Normal.   Abdominal:      General: Bowel sounds are normal. There is no distension.      Palpations: Abdomen is soft. There is no mass.      Tenderness: There is no abdominal tenderness. There is no right CVA  tenderness, left CVA tenderness, guarding or rebound.      Hernia: No hernia is present.   Musculoskeletal:      Cervical back: Neck supple.      Right lower leg: No edema.      Left lower leg: No edema.   Skin:     General: Skin is warm and dry.      Capillary Refill: Capillary refill takes less than 2 seconds.   Neurological:      General: No focal deficit present.      Mental Status: She is alert.      Cranial Nerves: Cranial nerves 2-12 are intact.      Motor: Motor function is intact.      Coordination: Coordination is intact.   Psychiatric:         Attention and Perception: Attention normal.         Mood and Affect: Mood normal.         Speech: Speech normal.         Behavior: Behavior normal. Behavior is cooperative.         Cognition and Memory: Cognition normal.           ASSESSMENT/PLAN  (K56.25) Periumbilical abdominal pain  (primary encounter diagnosis)  Plan: POCT Urine Dipstick, POCT Urine HCG, US ABDOMEN        COMPLETE (ADULT)    (E28.2) Polycystic ovary syndrome  Plan: US ABDOMEN COMPLETE (ADULT)    (D75.839) Thrombocytosis  Plan: Referral to HEMATOLOGY/ONCOLOGY - [place], [place]        ABDOMEN COMPLETE (ADULT)       Problem List Items Addressed This Visit          Digestive    Periumbilical abdominal pain - Primary     Abdominal US ordered, patient requested abdominal ultrasound to be scheduled at [facility].  CBC and CMP reviewed, scanned into chart.  Discussed urinalysis and HCG.  Advised to follow-up if symptoms progress or do not resolve.  Advised patient if she develops worsening pain to go to emergency room for further evaluation.  Patient agree with plan of care  Relevant Orders    POCT Urine Dipstick    POCT Urine HCG    US ABDOMEN COMPLETE (ADULT)       Endocrine    Polycystic ovary syndrome     Insulin 56.9.  A1c 5.7.  Patient will increase metformin to 750 mg extended release b.i.d..         Relevant Orders    US ABDOMEN COMPLETE (ADULT)       Hematologic/Lymphatic     Thrombocytosis     Platelets 511.  CBC x3, scanned into chart.  Referred to hematology for further evaluation.         Relevant Orders    Referral to HEMATOLOGY/ONCOLOGY - [place]    US ABDOMEN COMPLETE (ADULT)      Orders Placed This Encounter    US ABDOMEN COMPLETE (ADULT)    Referral to HEMATOLOGY/ONCOLOGY - [place]    POCT Urine Dipstick    POCT Urine HCG    metFORMIN (GLUCOPHAGE-XR) 750 mg Oral Tablet Sustained Release 24 hr      Discussed labs dated 03/27/2022.  Further treatment pending ultrasound.  Advised to follow up with specialist as scheduled.  Return to clinic sooner if needed.  Patient agree with plan of care.    Return if symptoms worsen or fail to improve, for Routine appointment scheduled for 06/22/2022.    Jodi Mourning, NP-C 04/12/2022, 12:10

## 2022-04-12 NOTE — Assessment & Plan Note (Signed)
Platelets 511.  CBC x3, scanned into chart.  Referred to hematology for further evaluation.

## 2022-04-12 NOTE — Assessment & Plan Note (Addendum)
Abdominal US ordered, patient requested abdominal ultrasound to be scheduled at [facility].  CBC and CMP reviewed, scanned into chart.  Discussed urinalysis and HCG.  Advised to follow-up if symptoms progress or do not resolve.  Advised patient if she develops worsening pain to go to emergency room for further evaluation.  Patient agree with plan of care

## 2022-04-12 NOTE — Assessment & Plan Note (Signed)
Insulin 56.9.  A1c 5.7.  Patient will increase metformin to 750 mg extended release b.i.d..

## 2022-04-16 ENCOUNTER — Encounter (RURAL_HEALTH_CENTER): Payer: Self-pay | Admitting: Family

## 2022-04-30 ENCOUNTER — Encounter (RURAL_HEALTH_CENTER): Payer: Self-pay | Admitting: Family

## 2022-06-08 ENCOUNTER — Ambulatory Visit (HOSPITAL_COMMUNITY): Payer: Self-pay

## 2022-06-22 ENCOUNTER — Ambulatory Visit (RURAL_HEALTH_CENTER): Payer: Self-pay | Admitting: Family

## 2022-08-05 ENCOUNTER — Other Ambulatory Visit: Payer: Self-pay

## 2022-08-30 IMAGING — MR MRI BRAIN WITHOUT AND WITH CONTRAST
10 of 11 series · 38 of 48 positions shown · IV contrast (gadavist)
Comparison: CT scan of sinuses dated 10/27/2021.

﻿EXAM:  [id number]   MRI BRAIN WITHOUT AND WITH CONTRAST
INDICATION: 41-year-old with history of severe frontal headache, worsening.  No history of trauma or malignancy.
TECHNIQUE: Axial, coronal and sagittal images were obtained including postcontrast series after administration of 5 mL Gadavist IV.

[Series 5: DWI · axial · 5.0mm · 1.35mm/px · z∈[-48,+78]mm · 9 of 88 slices shown (1 of 3)]
[im 1/88]
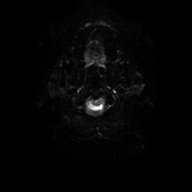
[im 15/88]
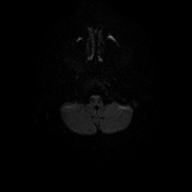
[im 30/88]
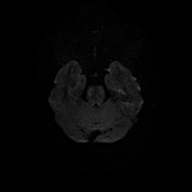
[im 37/88]
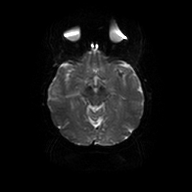
[im 44/88]
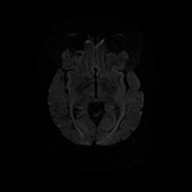
[im 51/88]
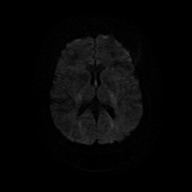
[im 59/88]
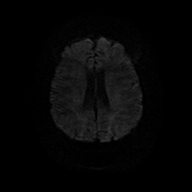
[im 73/88]
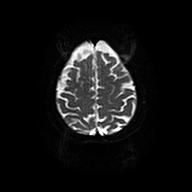
[im 88/88]
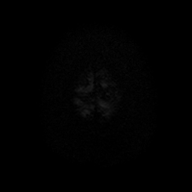

[Series 6: DWI · axial · 5.0mm · 1.35mm/px · z∈[-48,+78]mm · 3 of 22 slices shown (2 of 3)]
[im 1/22]
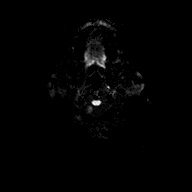
[im 11/22]
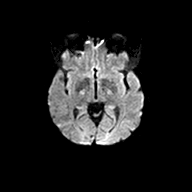
[im 22/22]
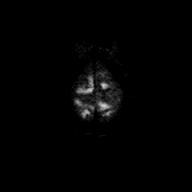

[Series 7: DWI · axial · 5.0mm · 1.35mm/px · z∈[-48,+78]mm · 3 of 22 slices shown (3 of 3)]
[im 1/22]
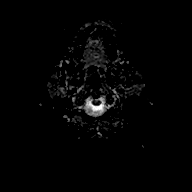
[im 11/22]
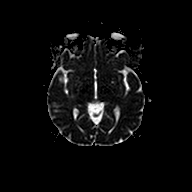
[im 22/22]
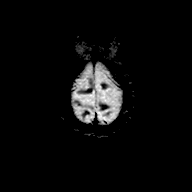

[Series 8: FLAIR · sagittal · 4.0mm · 0.75mm/px · 4 of 26 slices shown (1 of 2)]
[im 1/26]
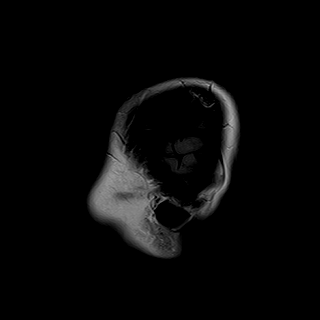
[im 9/26]
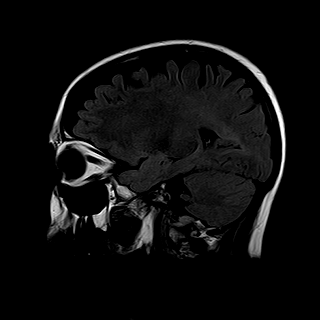
[im 17/26]
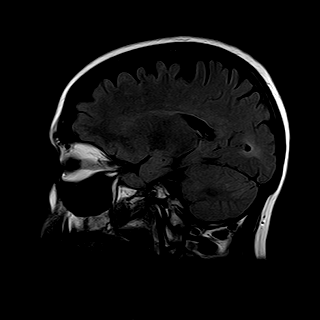
[im 26/26]
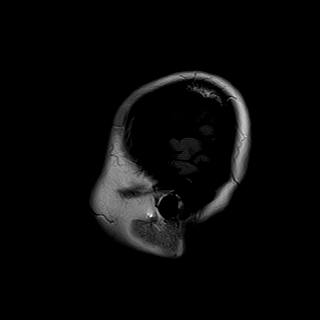

[Series 9: T2 · axial · 5.0mm · 0.43mm/px · z∈[-52,+86]mm · 4 of 24 slices shown (1 of 2)]
[im 1/24]
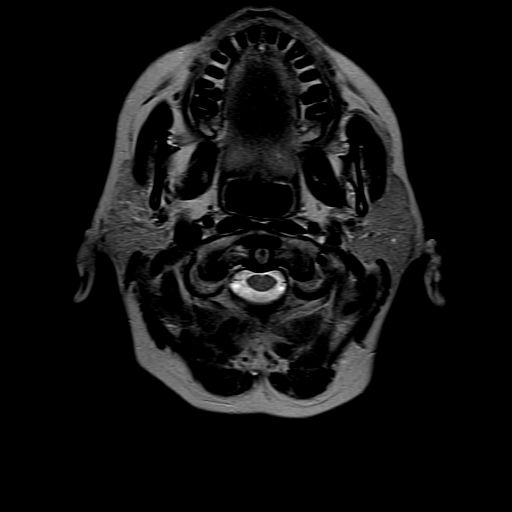
[im 8/24]
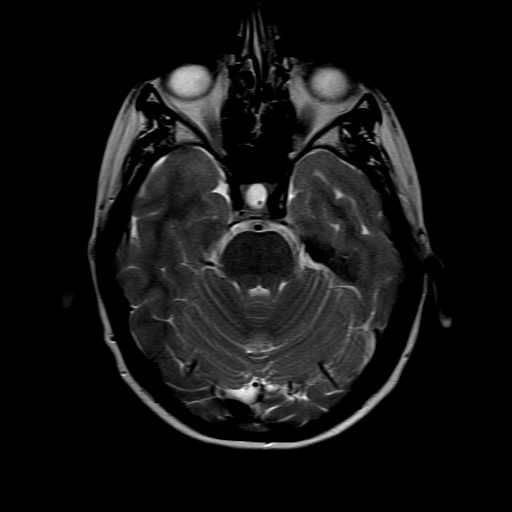
[im 16/24]
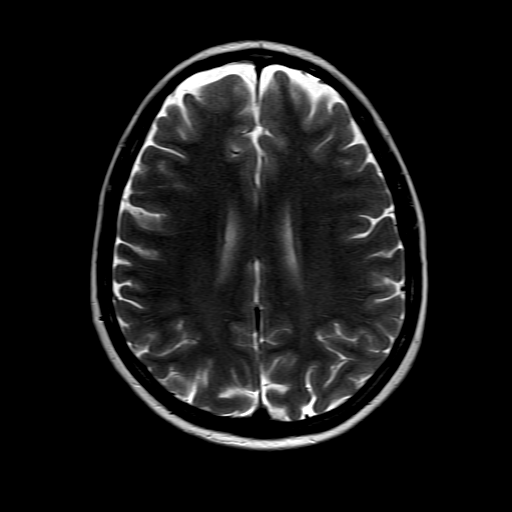
[im 24/24]
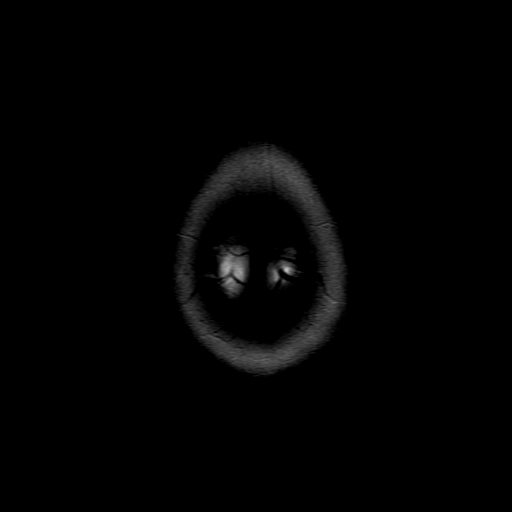

[Series 10: FLAIR · axial · 5.0mm · 0.76mm/px · z∈[-46,+80]mm · 3 of 22 slices shown (2 of 2)]
[im 1/22]
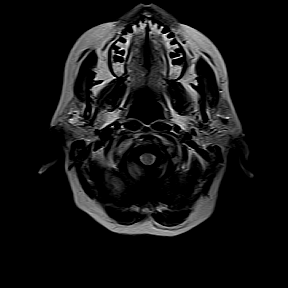
[im 11/22]
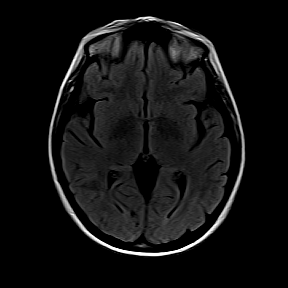
[im 22/22]
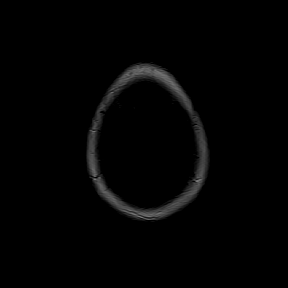

[Series 11: T1 · axial · 5.0mm · 0.69mm/px · z∈[-46,+80]mm · 3 of 22 slices shown]
[im 1/22]
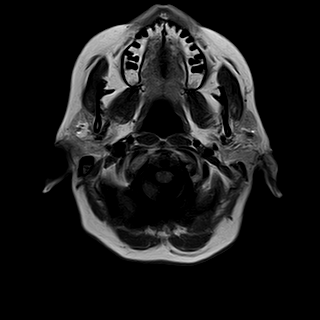
[im 11/22]
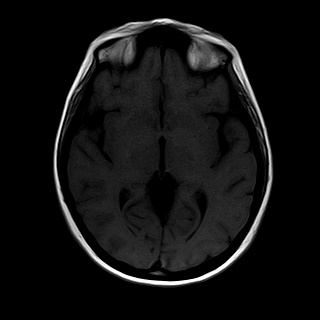
[im 22/22]
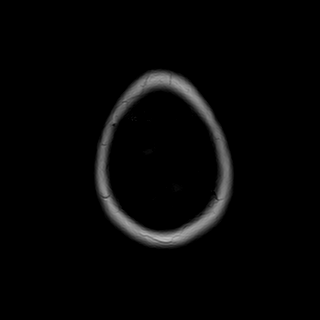

[Series 13: T2 · coronal · 6.0mm · 0.43mm/px · 4 of 24 slices shown (2 of 2)]
[im 1/24]
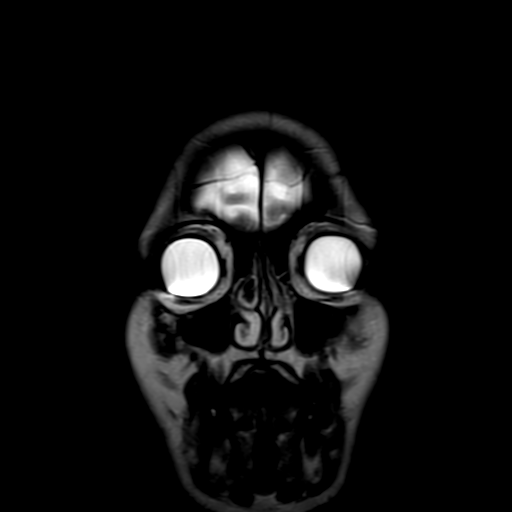
[im 8/24]
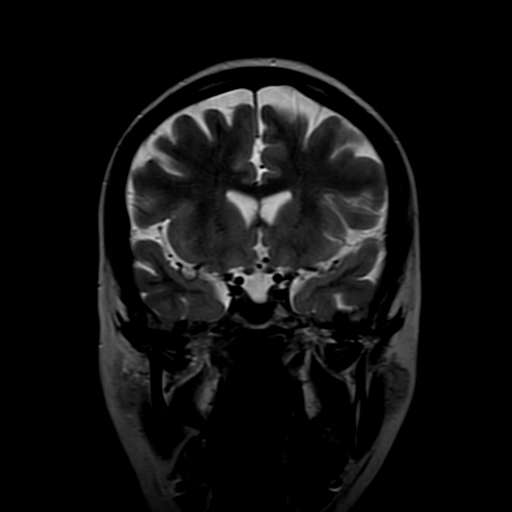
[im 16/24]
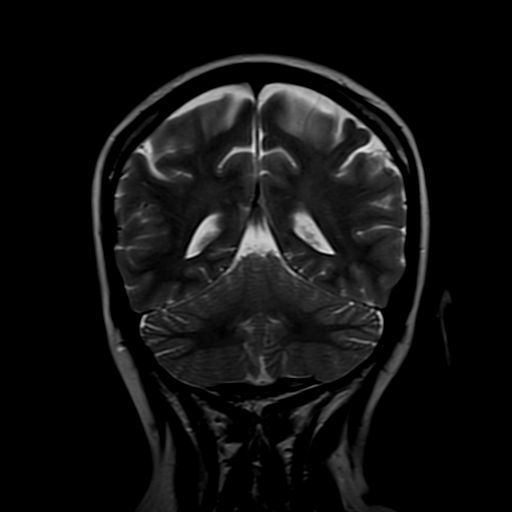
[im 24/24]
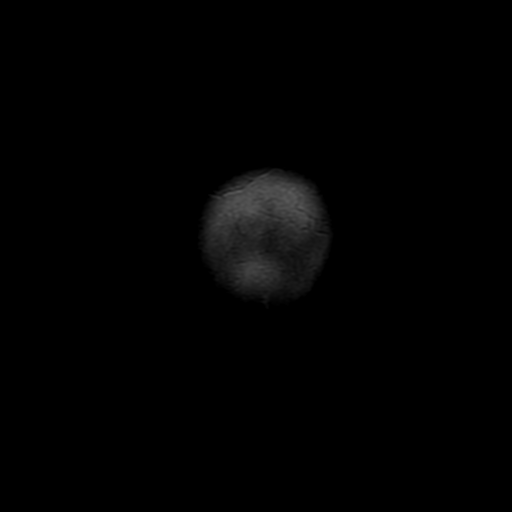

[Series 14: T1 post-contrast · axial · 5.0mm · 0.43mm/px · z∈[-55,+89]mm · 4 of 25 slices shown]
[im 1/25]
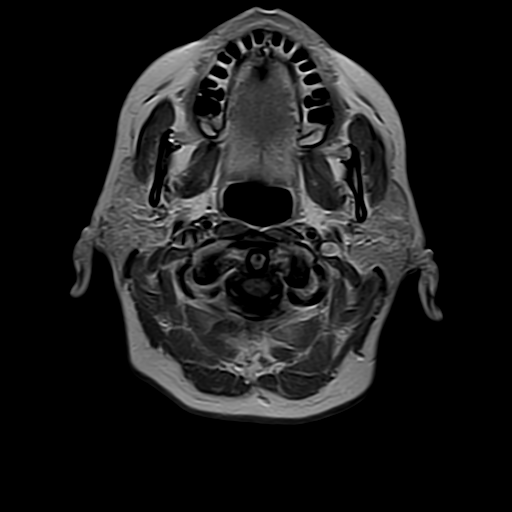
[im 9/25]
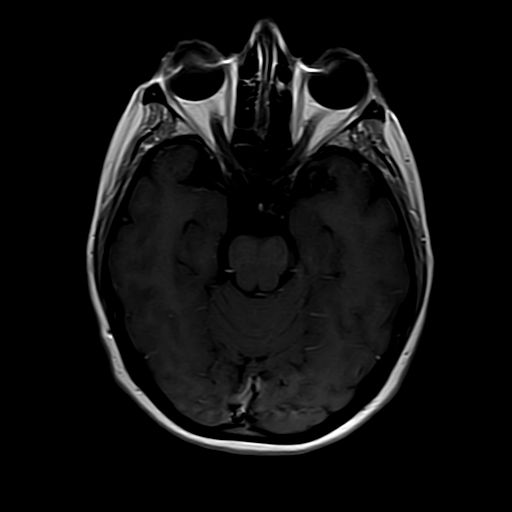
[im 17/25]
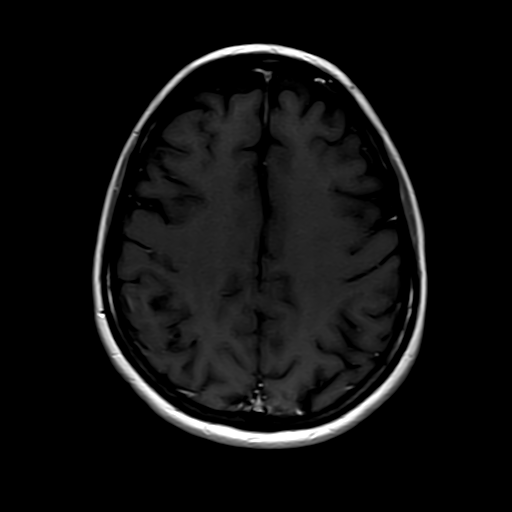
[im 25/25]
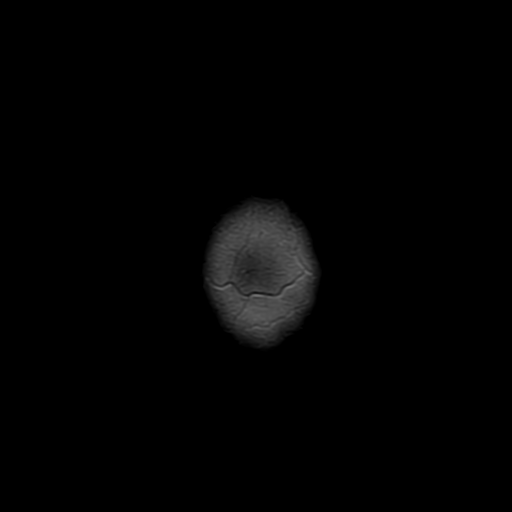

[Series 15: T1 fat-sat post-contrast · coronal · 5.0mm · 0.57mm/px · 1 of 24 slices shown]
[im 1/24]
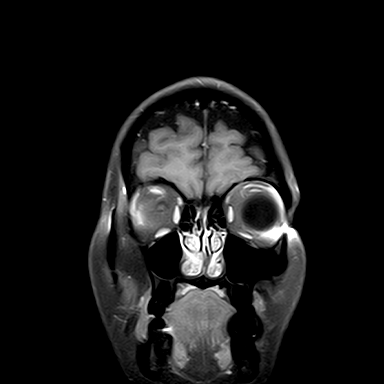

[38 of 48 positions shown; findings below may reference images not displayed]

FINDINGS: No evidence of acute ischemia, chronic ischemia are demyelinating lesions of the brain.  No evidence of ventriculomegaly or midline shift.  There is no evidence of abnormal enhancement on postcontrast study.  Pituitary gland is normal in appearance. 

The paranasal sinuses do not show acute findings.  Fluid in the mastoid cells on the left side suggests some degree of mastoiditis.
IMPRESSION: 1. No acute ischemia, chronic ischemia or demyelinating lesions of the brain.

2. No space-occupying lesions or abnormal enhancement on postcontrast study. 

3. Fluid in mastoid cells on the left side suggests some degree of mastoiditis on the left side.

## 2022-10-31 ENCOUNTER — Ambulatory Visit (RURAL_HEALTH_CENTER): Payer: Self-pay | Admitting: Family

## 2023-11-18 ENCOUNTER — Other Ambulatory Visit (RURAL_HEALTH_CENTER): Payer: Self-pay | Admitting: Family Medicine

## 2023-11-18 DIAGNOSIS — Z1231 Encounter for screening mammogram for malignant neoplasm of breast: Secondary | ICD-10-CM
